# Patient Record
Sex: Male | Born: 1955 | Race: White | Hispanic: No | Smoking: Former smoker
Health system: Southern US, Community
[De-identification: ages and names within clinical notes are randomized; demographics above are authoritative.]

## PROBLEM LIST (undated history)

## (undated) DIAGNOSIS — K7460 Unspecified cirrhosis of liver: Secondary | ICD-10-CM

## (undated) DIAGNOSIS — J90 Pleural effusion, not elsewhere classified: Secondary | ICD-10-CM

## (undated) DIAGNOSIS — B1920 Unspecified viral hepatitis C without hepatic coma: Secondary | ICD-10-CM

## (undated) DIAGNOSIS — S2249XA Multiple fractures of ribs, unspecified side, initial encounter for closed fracture: Secondary | ICD-10-CM

## (undated) DIAGNOSIS — S2239XA Fracture of one rib, unspecified side, initial encounter for closed fracture: Secondary | ICD-10-CM

## (undated) HISTORY — PX: INCISION AND DRAINAGE ABSCESS: SHX5864

## (undated) HISTORY — PX: TONSILLECTOMY: SUR1361

---

## 2010-07-08 ENCOUNTER — Ambulatory Visit (INDEPENDENT_AMBULATORY_CARE_PROVIDER_SITE_OTHER): Payer: BLUE CROSS/BLUE SHIELD | Admitting: Gastroenterology

## 2010-07-08 DIAGNOSIS — B182 Chronic viral hepatitis C: Secondary | ICD-10-CM

## 2010-07-08 DIAGNOSIS — B181 Chronic viral hepatitis B without delta-agent: Secondary | ICD-10-CM

## 2010-08-19 ENCOUNTER — Ambulatory Visit: Payer: BLUE CROSS/BLUE SHIELD | Admitting: Gastroenterology

## 2010-08-24 ENCOUNTER — Other Ambulatory Visit: Payer: Self-pay | Admitting: Gastroenterology

## 2010-08-24 DIAGNOSIS — B1910 Unspecified viral hepatitis B without hepatic coma: Secondary | ICD-10-CM

## 2010-08-25 ENCOUNTER — Ambulatory Visit (HOSPITAL_COMMUNITY)
Admission: RE | Admit: 2010-08-25 | Discharge: 2010-08-25 | Disposition: A | Discharge status: Home / Self Care | Payer: BC Managed Care – PPO | Source: Ambulatory Visit | Attending: Gastroenterology | Admitting: Gastroenterology

## 2010-08-25 ENCOUNTER — Other Ambulatory Visit: Payer: Self-pay | Admitting: Diagnostic Radiology

## 2010-08-25 DIAGNOSIS — B1910 Unspecified viral hepatitis B without hepatic coma: Secondary | ICD-10-CM | POA: Insufficient documentation

## 2010-08-25 DIAGNOSIS — B1920 Unspecified viral hepatitis C without hepatic coma: Secondary | ICD-10-CM | POA: Insufficient documentation

## 2010-08-25 LAB — PROTIME-INR: Prothrombin Time: 14.9 seconds (ref 11.6–15.2)

## 2010-08-25 LAB — CBC
MCH: 32.1 pg (ref 26.0–34.0)
MCHC: 36.7 g/dL — ABNORMAL HIGH (ref 30.0–36.0)
MCV: 87.5 fL (ref 78.0–100.0)
Platelets: 222 10*3/uL (ref 150–400)
RBC: 5.05 MIL/uL (ref 4.22–5.81)

## 2010-08-26 ENCOUNTER — Ambulatory Visit (HOSPITAL_COMMUNITY): Payer: BLUE CROSS/BLUE SHIELD

## 2010-09-02 ENCOUNTER — Encounter: Payer: Self-pay | Admitting: Gastroenterology

## 2010-09-23 ENCOUNTER — Ambulatory Visit (INDEPENDENT_AMBULATORY_CARE_PROVIDER_SITE_OTHER): Payer: BLUE CROSS/BLUE SHIELD | Admitting: Gastroenterology

## 2010-09-23 VITALS — BP 162/98 | HR 87 | Temp 98.0°F | Ht 66.0 in | Wt 246.0 lb

## 2010-09-23 DIAGNOSIS — B181 Chronic viral hepatitis B without delta-agent: Secondary | ICD-10-CM

## 2010-09-23 DIAGNOSIS — B182 Chronic viral hepatitis C: Secondary | ICD-10-CM

## 2010-09-30 NOTE — Progress Notes (Signed)
NAME:  Francisco Floyd, Francisco Floyd    MR#:  [id number]      DATE:  09/23/2010  DOB:  Jul 20, 1955    cc: Consulting Physician:  Marcellus Scott, MD, [facility], [address], [place], [place] [address], [place] [phone]  Referring Physician:  [name], FNP c/o Louanna Raw, [facility], [address], [place], [place] [address], Fax [phone]  [name],, MD, [facility], [address], [place], [place] [address], Fax [phone]    REASON FOR VISIT:  Follow up of E antibody positive, genotype unknown inactive hepatitis B and genotype 1a hepatitis C coinfection.    History:  The patient returns today unaccompanied. Since last being seen, he has no symptoms referable to his liver disease. There are no symptoms to suggest cryoglobulin mediated or decompensated liver disease.   PAST MEDICAL HISTORY:  Significant for history of lung disease. I received notes from Dr. Blenda Nicely, his pulmonologist, but there is no definitive diagnosis given in the notes I received. However, today the patient reports he has no  symptoms on his current medications. He also has a history of psoriatic arthritis, which is somewhat active and limits his mobility, and therefore his ability to weight.  Reviewing the notes from Dr.  Corliss Skains, she acknowledges difficulty in trying to treat his psoriatic disease because of his viral hepatitis.    CURRENT MEDICATIONS:  Dulera aerosol inhalation daily, Bystolic 10 mg p.o. daily, oxycodone 5 mg p.o. q. 6 hours p.r.n. for joint pains, omeprazole 20 mg p.o. daily, for an occasional single outbreak will take Neurontin 300 mg  p.o. p.r.n. and Acyclovir 200 mg p.o. daily. In addition he is on a number of supplements including fish oil 1000 mg b.i.d., Ester C 1000 mg daily, milk thistle 1000 mg b.i.d., aspirin 81 mg p.o. daily,  chromium picolinate, Histidine 500 mg p.o. daily, vitamin D 3000  units b.i.d., vitamin B12 1000 mcg p.o. t.i.d., B complex t.i.d., ginseng 100 mg p.o. b.i.d., Acai 1000 mg b.i.d., ginkgo biloba 120 mg p.o.  b.i.d., acetyl L-carnitine 400 mg p.o. daily, alpha lipoic acid 200 mg p.o. daily.   ALLERGIES:  Flagyl causes erythema.   HABITS:  Quit smoking in 2000. Alcohol still reports drinking up to 12 beers per week.    REVIEW OF SYSTEMS:  All 10 systems reviewed today with the patient and they are negative other than which was mentioned above and is significant only for the arthralgias.  CES-D was 14.    PHYSICAL EXAMINATION:   Constitutional:  Central obesity, but otherwise, appears well. Vital signs: Height 66 inches. Weight 246 pounds, blood pressure 152/98, pulse of 87, temperature 98 Fahrenheit.    laboratories:  Testing from 07/08/2010; his INR was 1.23, albumin was 3.8, total bilirubin 1.1, creatinine was 0.76. Total A antibody was positive, B surface antigen was positive, B surface antibody negative, E antibody  positive, E antigen negative. His HBV DNA was detectable but not quantifiable, i.e. less than 20 international units per mL. His delta antibody was negative.  In terms of his hepatitis C, his genotype was 1a, with a viral load of 2,170,000 international units per mL.  Biopsy on 08/25/2010, showed cirrhosis. The iron stain was negative. The PAS stain was also negative.   assessment:  The patient is a 55 year old gentleman with history of E antibody positive, genotype unknown hepatitis B that is currently inactive and genotype 1a hepatitis C with a biopsy on 08/25/2010, showing  stage IV  disease, i.e., cirrhosis.  In terms of his genotype 1a hepatitis C, I do not see any absolute contraindication to trying to treat him, although it is possible that if he had the psoriatic arthritis, it would flare up on treatment. 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 Fortunately, at that point  we could discontinue treatment if it was that burdensome to him. I would  consider using boceprevir so that he could receive a run of interferon and ribavirin first to see if he the develops a flare of his psoriasis before starting on the protease inhibitor so if we have to stop  treatment, the initial attempt at treatment would not foster resistance to an incompletely used protease inhibitor.  In terms of his hepatitis B, currently he is viral load is less than 20 international units per mL and therefore does not need therapy for this as a compensated cirrhotic.  Interferon can be given to a compensated cirrhotic with Hep B.  In terms of his cirrhosis care, there is no indication of thrombocytopenia, so I do not think he needs to be screened for varices. Of course, he needs to stop drinking. There is no ascites or  encephalopathy to treat. He needs hepatocellular cancer surveillance. He is hepatitis A immune by his lab testing.  In my discussion today with the patient, we discussed his results of lab testing and biopsy.  I have explained to him that the next step would be to attempt treatment of his hepatitis C. I have explained to him though that the interferon could worsen his psoriasis, which he states he was aware of. He has a brother who has been treated for hepatitis C. We discussed our treatment protocol including lab testing. I reviewed the specific system, constitutional, psychiatric side effects of therapy. We also discussed the need for screening ultrasound for hepatocellular cancer.   I told him he must not drink alcohol.  The patient expressed considerable concern about the cost of all medications. He reports he has 30% co-pay on all prescriptions and other medical expenses. He is concerned he may not be of able to afford therapy. I explained to him that the only thing I could do was run this through his insurance, which is [employer] with [employer] as the pharmacy Manufacturing systems engineer to  see what will be approved.   PLAN:  1. I have asked for an ultrasound of the liver to be done at [facility] for his convenience. This will need to be repeated every 6 months. 2. There is no indication for endoscopy to screen for varices. 3. He is hepatitis A immune. 4. I will submit applications for pegylated interferon, ribavirin, and boceprevir to see how much they cost. 5. I will tentatively schedule him for a 4-6 month follow up in case he does not start on therapy. At that point, we can book his next surveillance ultrasound, if he is not on therapy.            [name], MD   ADDENDUM: Prior auth received.  Faxed forms to [facility].  [phone] .[id number]  D:  Thu Jun 28 17:34:56 2012 ; T:  Thu Jun 28 20:36:12 2012  Job #:  [id number]

## 2010-10-21 ENCOUNTER — Other Ambulatory Visit: Payer: Self-pay | Admitting: Gastroenterology

## 2010-10-21 ENCOUNTER — Ambulatory Visit (INDEPENDENT_AMBULATORY_CARE_PROVIDER_SITE_OTHER): Payer: BC Managed Care – PPO | Admitting: Gastroenterology

## 2010-10-21 DIAGNOSIS — B182 Chronic viral hepatitis C: Secondary | ICD-10-CM

## 2010-10-28 NOTE — Progress Notes (Signed)
NAME:  Francisco Floyd, Francisco Floyd    MR#:  [id number]      DATE:  10/21/2010  DOB:  [birth year]    cc: Consulting Physician:  Shirlee Limerick, MD, [facility], [address], [address], [place], [place] [address], [place] [phone] Referring Physician:  [name], FNP, C/O Louanna Raw, MD, [facility], [address], [place], [place] [address], Fax [phone] [name], MD, [facility], [address], [place], [place] [address], Fax [phone]    REASON FOR VISIT:  Follow up of E antibody positive, genotype unknown inactive hepatitis B and genotype 1a hepatitis C co-infection.    History:  The patient returns today unaccompanied. He is here to be taught how to self administer his Pegasys, ribavirin and boceprevir. He has received a shipment of the Pegasys and ribavirin from [place] and has been told he will receive boceprevir at a later date. He currently has no symptoms referable to his history of hepatitis B or C and there are no symptoms to suggest cryoglobulin mediated or decompensated liver disease.   Past medical history:  Significant for a lung disease. As previously mentioned. I reviewed Dr. Minerva Fester these notes and was unable to conclude what the diagnosis was but it but does not appear to be a contraindication to  treating his hepatitis C. He has  psoriatic disease particularly psoriatic arthritis, which is why I have chosen to use interferon and  ribavirin in combination first before adding the boceprevir in case he has a significant flare on interferon.   CURRENT MEDICATION:  Dulera aerosol inhalation daily, Bystolic 10 mg p.o. daily, oxycodone 5 mg p.o. q. 6 hours p.r.n. for arthralgias, omeprazole 20 mg p.o. daily, Neurontin 300 mg p.o. p.r.n., acyclovir 200 mg p.o. daily. He  continues on fish oil 1000 mg p.o. b.i.d., Ester C 1000 mg p.o. daily, milk thistle 1000 mg p.o. b.i.d., aspirin 81 mg  p.o. daily, chromium picolinate histidine 500 mg p.o. daily, vitamin D 3000 units p.o.  daily, vitamin B12 1000 mcg p.o. t.i.d., B complex t.i.d., Acai 1000 mg p.o. b.i.d., Acetyl L-carnitine 400 mg p.o. daily, alpha lipoic acid 200 mg p.o. daily. He has been told to stop the ginseng and the gingko biloba by the pharmacy.   ALLERGIES:  Flagyl causes erythema.    Habits:  Smoking, quit in 2000. Alcohol denies any alcohol consumption since last being seen.   REVIEW OF SYSTEMS:  All 10 systems reviewed today with. [employer] and negative other which is mentioned above. His CES-D was 13.   Physical examination:  Constitutional:  Central obesity. Vital signs: Height 66 inches, weight 248 pounds, blood pressure 165/106, pulse of 89, temperature 97.7 Fahrenheit.   LABORATORY STUDIES:  Labs from 07/08/2010 were reviewed.  Of note, he is hepatitis A immune, hepatitis B surface antigen was positive, but his HBV DNA was detectable but less than 20 international units per mL, and his E  antibody was positive. His HCV RNA was 2,170,000 international units per mL, genotype 1a.   Assessment:  The patient is a 55 year old gentleman with history of genotype 1a hepatitis C co-infection with E antibody positive genotype unknown in active hepatitis B with a biopsy on 08/25/2010 showing macro steatosis  and cirrhosis.  He is well compensated despite his cirrhosis. We plan to start him on hepatitis C therapy consisting of pegylated interferon and ribavirin and boceprevir. His start date will be tomorrow, 10/22/2010.  Today, I demonstrated to the  patient how to dose his ribavirin and the pegylated interferon with the PEG-assist demonstration kit in addition to his own medication, which he brought in today. I gave him the  boceprevir education kit from the manufacturer to use and went through the list of side effects and how to manage these in addition to drug interactions.  In terms of his cirrhosis care, there is  no indication of thrombocytopenia, so I do not think he needs to be screened for varices. There is no ascites or encephalopathy to treat. He is  hepatitis A immune by lab testing. His HCC screening has been accomplished through an ultrasound of the liver on 10/13/2010, which shows a liver that is dense but normal in size. This will need to be repeated in January 2013.   plan:  1. Hepatitis A immune. 2. HCV RNA today as pretreatment viral load. 3. Start on Pegasys and ribavirin tomorrow, 10/22/2010. 4. He is to return on 11/04/2010 for his 2 week visit at which time we will do a CBC. 5. Boceprevir will be added after 11/12/2010. 6. Will need another ultrasound in January 2013.            [name], MD    ADDENDUM  HCV RNA 1 140 000 IU/mL  [phone] .[id number]  D:  Thu Jul 26 18:58:23 2012 ; T:  Thu Jul 26 22:13:06 2012  Job #:  [id number]

## 2010-11-04 ENCOUNTER — Ambulatory Visit (INDEPENDENT_AMBULATORY_CARE_PROVIDER_SITE_OTHER): Payer: BC Managed Care – PPO | Admitting: Gastroenterology

## 2010-11-04 ENCOUNTER — Other Ambulatory Visit: Payer: Self-pay | Admitting: Gastroenterology

## 2010-11-04 VITALS — BP 144/92 | HR 85 | Temp 97.8°F | Ht 66.0 in | Wt 246.0 lb

## 2010-11-04 DIAGNOSIS — B181 Chronic viral hepatitis B without delta-agent: Secondary | ICD-10-CM

## 2010-11-04 DIAGNOSIS — B182 Chronic viral hepatitis C: Secondary | ICD-10-CM

## 2010-11-04 LAB — CBC WITH DIFFERENTIAL/PLATELET
Eosinophils Absolute: 0 10*3/uL (ref 0.0–0.7)
Eosinophils Relative: 0 % (ref 0–5)
HCT: 39 % (ref 39.0–52.0)
Hemoglobin: 13.4 g/dL (ref 13.0–17.0)
Lymphocytes Relative: 36 % (ref 12–46)
Lymphs Abs: 2 10*3/uL (ref 0.7–4.0)
MCH: 30.9 pg (ref 26.0–34.0)
MCV: 90.1 fL (ref 78.0–100.0)
Monocytes Relative: 10 % (ref 3–12)
Platelets: 175 10*3/uL (ref 150–400)
RBC: 4.33 MIL/uL (ref 4.22–5.81)
WBC: 5.6 10*3/uL (ref 4.0–10.5)

## 2010-11-11 NOTE — Progress Notes (Signed)
Francisco Floyd, Francisco Floyd    MR#:  [id number]      DATE:  11/04/2010  DOB:  [birth year]    cc:     Referring physician:  [name], FNP c/o Louanna Raw, MD  [facility] [address]  [place], [place]   [address] Fax:  [phone]   Primary care physician:  [name], FNP C/o Louanna Raw, MD  [facility] [address]  [place], [place]   [address] Fax:  [phone]   CONSULTING PHYSICIANS:  Shirlee Limerick, MD  [facility]  [address], [address]  [place], [place]   [address] Fax:  [phone]    [name], MD  [facility] [address], [address]  [place],  [place]   [address] Fax:  [phone]   REASON FOR VISIT:  Follow up of E antibody positive, genotype unknown, inactive hepatitis B and genotype 1A hepatitis C co-infection, on hepatitis C therapy at week 2.   History:  The patient returns today unaccompanied. His start date for Pegasys and ribavirin was 10/22/2010, putting him at week 2 of therapy, with his third dose of Pegasys to be tomorrow.  He is due to start  boceprevir on 11/19/2010.  He is tolerating therapy with slight fevers right after his PEG-interferon therapy, but otherwise he has no complaints.   PAST MEDICAL HISTORY:  No interval change.   CURRENT MEDICATIONS:  1. Bystolic 10 mg p.o. daily. 2. Dulera aerosol inhalation daily. 3. Oxycodone 5 mg p.o. q. 6 hours p.r.n. for arthralgias. 4. Omeprazole 20 mg p.o. daily. 5. Neurontin 300 mg p.o. p.r.n. 6. Acyclovir 200 mg p.o. daily. 7. Fish oil 1000 mg p.o. b.i.d.  8. Ester-C 1000 mg p.o. daily. 9. Milk thistle 1000 mg p.o. b.i.d. 10. Aspirin 81 mg p.o. daily. 11. Chromium picolinate histidine 500 mg p.o. daily. 12. Vitamin D, 3000 units p.o. daily. 13. Vitamin B12 1000 micrograms p.o. t.i.d.  14. B complex t.i.d.  15. Acetyl L-carnitine 400 mg p.o.  daily. 16. Alpha lipoic acid 200 mg p.o. daily. 17. Pegasys 180 micrograms subcu weekly. 18. Ribavirin 600 mg p.o. b.i.d.  ALLERGIES:  Flagyl causes erythema.    Habits:   Smoking:  Quit in 2000.   Alcohol:  Denies interval consumption since last being seen.   REVIEW OF SYSTEMS:  All 10 systems reviewed today with the patient and are negative other than which was mentioned above. His CES-D was 57.     PHYSICAL EXAMINATION:   Constitutional:  Central obesity.   Vital signs:  Height 66 inches, weight 246 pounds.  Blood pressure 144/92, pulse of 85, temperature 97.8.   Laboratory data:  His pretreatment viral load 10/21/2010 was 1,140,000 international units per milliliter.   ASSESSMENT:  The patient is a 55 year old gentleman with a history genotype hepatitis C co-infected with E antibody positive genotype unknown inactive hepatitis B.  A biopsy on 08/25/2010 showed macrosteatosis  and cirrhosis.  His IL 28B status is unknown, but he is committed to a year of therapy because of cirrhosis. He is currently at week 2 of a planned 48 weeks of pegylated interferon and ribavirin with boceprevir  to be added at week 4.  In terms of his cirrhosis, he has been screened for [facility] by ultrasound 10/13/2010, and he needs another screening test by January 2013. There is no ascites or encephalopathy to treat. He does not have  thrombocytopenia, so he does  not need screening for varices.  He is hepatitis A immune by laboratory testing.  In terms of hepatitis B, it has previously been inactive and does not require therapy at this time but will require monitoring within next HBV DNA to be done around now, but I would like to wait a little  longer while on interferon to see the effect of treating his hepatitis C on his hepatitis B.  In my discussion today with the patient, we reviewed his course of therapy today. I have explained to him that he will have a CBC today, continue with interferon and ribavirin,  and then start boceprevir on  10/19/2010 after he sees me at his next appointment at week 4.  He indicates that he will contact his [facility], his pharmacy, before running out of his medications at the end of the month.   plan:  1. CBC today. 2. To return in 2 weeks' time on 11/18/2010 for his week 4 visit, at which time will do an RNA and CBC and instruct him on starting boceprevir.  He is hepatitis A immune. 3. I will check an HBV DNA at week 8 of HCV therapy. 4. Needs another imaging of the liver by January 2013.            [name], MD   [phone]  D:  Thu Aug 09 19:12:53 2012 ; T:  Sat Aug 11 11:59:10 2012  Job #:  [id number]

## 2010-11-18 ENCOUNTER — Ambulatory Visit (INDEPENDENT_AMBULATORY_CARE_PROVIDER_SITE_OTHER): Payer: BC Managed Care – PPO | Admitting: Gastroenterology

## 2010-11-18 DIAGNOSIS — B182 Chronic viral hepatitis C: Secondary | ICD-10-CM

## 2010-11-18 LAB — CBC WITH DIFFERENTIAL/PLATELET
Eosinophils Relative: 1 % (ref 0–5)
HCT: 38.2 % — ABNORMAL LOW (ref 39.0–52.0)
Hemoglobin: 12.7 g/dL — ABNORMAL LOW (ref 13.0–17.0)
Lymphocytes Relative: 33 % (ref 12–46)
MCHC: 33.2 g/dL (ref 30.0–36.0)
MCV: 93.2 fL (ref 78.0–100.0)
Monocytes Absolute: 0.5 10*3/uL (ref 0.1–1.0)
Monocytes Relative: 11 % (ref 3–12)
Neutro Abs: 2.6 10*3/uL (ref 1.7–7.7)
RDW: 16.2 % — ABNORMAL HIGH (ref 11.5–15.5)

## 2010-12-02 ENCOUNTER — Ambulatory Visit (INDEPENDENT_AMBULATORY_CARE_PROVIDER_SITE_OTHER): Payer: BC Managed Care – PPO | Admitting: Gastroenterology

## 2010-12-02 DIAGNOSIS — B182 Chronic viral hepatitis C: Secondary | ICD-10-CM

## 2010-12-02 NOTE — Progress Notes (Signed)
Referring physician:  Rosezella Rumpf, FNP c/o Louanna Raw, MD  [facility] [address] [place], [place]   [address] Fax:  [phone]    Primary care physician:  Rosezella Rumpf, FNP c/o Louanna Raw, MD  [facility] [address] [place], [place]   [address] Fax:  [phone]    Consulting physician:  Shirlee Limerick, MD  [facility]  [address], [address]  [place], [place]   [address] Fax: [phone]   [name], MD [facility]  [address] [place],  [place]   [address] Fax:  [phone]   REASON FOR VISIT:  Follow up of E antibody positive, genotype unknown, inactive hepatitis B, and genotype 1A hepatitis C co-infection, on hepatitis C therapy at week 4.   history:  The patient returns today unaccompanied. His start date for Pegasys and ribavirin was 10/22/2010, putting him at approximately week 4 of treatment, or 27 days or partial week 4 of therapy.  He is due to  boceprevir tomorrow to add to his PEG interferon and ribavirin. He complains of poor sleep and some irritability at times. He brings a  symptom log in today which reflects this. Otherwise, are no significant side effects of treatment at this time.   PAST MEDICAL HISTORY:  No interval change.   CURRENT MEDICATIONS:  1. Bystolic 10 mg p.o. daily.  2. Dulera aerosol inhalation daily. 3. Oxycodone 5 mg p.o. q. 6 hours p.r.n. for arthralgias. 4. Omeprazole 20 mg p.o. daily.  5. Neurontin 300 mg p.o. p.r.n.  6. Acyclovir 200 mg p.o. daily. 7. Fish oil 1000 mg p.o. b.i.d.  8. Ester-C 1000 mg p.o. daily. 9. Milk thistle 1000 mg p.o. b.i.d. 10. Aspirin 81 mg p.o. daily. 11. Chromium picolinate histidine 500 mg p.o. daily. 12. Vitamin D 3000 units daily. 13. Vitamin B12 1000 micrograms p.o. t.i.d.  14. Vitamin B complex t.i.d.  15. Acetyl L-carnitine 400 mg p.o. daily. 16. Alpha lipoic acid 200 mg p.o.  daily. 17. Pegasys 180 micrograms subcu weekly. 18. Ribavirin 600 mg p.o. b.i.d.  ALLERGIES:  Flagyl causes erythema.    Smoking:  Quit in 2000.     Alcohol:  Denies interval consumption.   REVIEW OF SYSTEMS:  All 10 systems reviewed today with the patient and are negative other than that mentioned above. His CES-D was 13.   PHYSICAL EXAMINATION:   Constitutional:  Well appearing.   Vital signs:  Height 66 inches, weight 246 pounds.  Blood pressure 147/84, pulse of 80, temperature 98 degrees Fahrenheit.    IMPRESSION:  The patient is a 55 year old gentleman with a history of genotype unknown, inactive hepatitis B, and genotype 1A hepatitis co-infection. IL28-B status is unknown. A biopsy on 08/25/2010 showed macrosteatosis  with cirrhosis. Because of the cirrhosis, he is planned to be treated for 48 weeks. He is currently in week 4, or day 27. His pretreatment viral load was 1,140,000 international units per milliliter.   Telaprevir is to be added tomorrow.   In terms of cirrhosis care, he has been screened for hepatocellular carcinoma by ultrasound of 10/13/2010 and needs another one by January 2013. There is no ascites or encephalopathy to treat, and he does not  need endoscopy because he does not have thrombocytopenia. He is hepatitis A immune by laboratory testing.  In terms of hepatitis B, it does not require therapy at this time provided the HBV DNA is suppressed. This will need to  be tracked on therapy for his hepatitis C.  In my discussion today with the patient, we reviewed his course of treatments to date.  He is aware of how to start the boceprevir and has this on hand.   plan:  1. CBC today. 2. HCV RNA today. 3. He is hepatitis A immune. 4. I will check an HBV DNA in 4 weeks' time at week 8 of HCV therapy. 5. Need another imaging study of the liver by January 2013. 6. Follow up in 2 weeks' time, at which time will discuss putting him on weekly CBCs.             [name], MD   ADDENDUM:  HCV RNA not done as ordered.  [phone] .[id number]  D:  Thu Aug 23 20:46:00 2012 ; T:  Sat Aug 25 12:46:22 2012  Job #:  [id number]

## 2010-12-03 LAB — CBC WITH DIFFERENTIAL/PLATELET
Eosinophils Relative: 0 % (ref 0–5)
HCT: 36.5 % — ABNORMAL LOW (ref 39.0–52.0)
Hemoglobin: 11.9 g/dL — ABNORMAL LOW (ref 13.0–17.0)
Lymphocytes Relative: 36 % (ref 12–46)
Lymphs Abs: 1.6 10*3/uL (ref 0.7–4.0)
MCV: 95.5 fL (ref 78.0–100.0)
Monocytes Absolute: 0.6 10*3/uL (ref 0.1–1.0)
Monocytes Relative: 12 % (ref 3–12)
Neutro Abs: 2.3 10*3/uL (ref 1.7–7.7)
WBC: 4.5 10*3/uL (ref 4.0–10.5)

## 2010-12-04 LAB — HEPATITIS C RNA QUANTITATIVE: HCV Quantitative Log: <1.63 {Log} (ref <1.63)

## 2010-12-16 ENCOUNTER — Ambulatory Visit (INDEPENDENT_AMBULATORY_CARE_PROVIDER_SITE_OTHER): Payer: BC Managed Care – PPO | Admitting: Gastroenterology

## 2010-12-16 ENCOUNTER — Other Ambulatory Visit: Payer: Self-pay | Admitting: Gastroenterology

## 2010-12-16 DIAGNOSIS — B182 Chronic viral hepatitis C: Secondary | ICD-10-CM

## 2010-12-16 DIAGNOSIS — B181 Chronic viral hepatitis B without delta-agent: Secondary | ICD-10-CM

## 2010-12-16 NOTE — Progress Notes (Signed)
Francisco Floyd, Francisco Floyd    MR#:  [id number]      DATE:  12/02/2010  DOB:  [birth year]    cc:     Referring physician:  Rosezella Rumpf, FNP c/o Louanna Raw, MD  [facility]  [address]  [place], [place]   [address] Fax:  [phone]    Primary care physician:  Rosezella Rumpf, FNP c/o Louanna Raw, MD  [facility]  [address]  [place], [place]   [address] Fax:  [phone]   CONSULTING PHYSICIANS:   Shirlee Limerick, MD [facility]  [address] [place], [place]   [address] Fax:  [phone]  Mercie Eon, MD  [facility]  [address] [place], [place]   [address] Fax:  [phone]   REASON FOR VISIT:  Follow up of E antibody positive, genotype unknown, inactive hepatitis B, and genotype 1A hepatitis C co-infection, on hepatitis C therapy at week 6.   History:  The patient returns unaccompanied. His start date for Pegasys and ribavirin was 10/22/2010, putting him at week 6 of therapy.  Boceprevir was started at his last clinic appointment on 11/19/2010.   He continues to complain of poor sleep and some irritability but no other significant symptoms.   PAST MEDICAL HISTORY:  No interval change.   CURRENT MEDICATIONS:  1. Bystolic 10 mg p.o. daily.  2. Dulera aerosol inhalation daily. 3. Oxycodone 5 mg p.o. q. 6 hours p.r.n. for arthralgias. 4. Omeprazole 20 mg p.o. daily.  5. Neurontin 300 mg p.o. p.r.n.  6. Acyclovir 200 mg p.o. daily. 7. Fish oil 1000 mg p.o. b.i.d.  8. Ester-C 1000 mg p.o. daily. 9. Milk thistle 1000 mg p.o. b.i.d. 10. Aspirin 81 mg p.o. daily. 11. Chromium picolinate histidine 500 mg p.o. daily. 12. Vitamin D 3000 units daily. 13. Vitamin B12 1000 micrograms p.o. t.i.d.  14. Vitamin B complex t.i.d.  15. Acetyl L-carnitine 400 mg p.o. daily. 16. Alpha lipoic acid 200 mg p.o. daily. 17. Pegasys 180 micrograms subcu  weekly. 18. Ribavirin 600 mg p.o. b.i.d. 19. Boceprevir 800 mg p.o. q.8 h.  ALLERGIES:  Flagyl causes erythema.     habits:  Smoking:  Quit in 2000.  Alcohol: Denies interval consumption.   REVIEW OF SYSTEMS:  All 10 systems reviewed today with the patient, and they are negative other than that which is mentioned above. CES-D was 18.   PHYSICAL EXAMINATION:   General appearance:  Well appearing with some element of central obesity.   Vital signs:  Height 66 inches.  Weight 244 pounds down 2 pounds from previously.  Blood pressure 143/80, pulse of 83, temperature 98.3 degrees Fahrenheit.    History of presenting illness:  The patient is a 55 year old gentleman with a history of genotype unknown inactive hepatitis B and genotype hepatitis C co-infection.  His IL28B status is unknown. Biopsy on 08/25/2010 showed  macrosteatosis with cirrhosis.  Because of the cirrhosis I plan to treat him for 48 weeks with Pegasys, ribavirin, and boceprevir. He is currently at week 6. His pretreatment viral load was 1,140,000  international units per mL. Unfortunately, due to a laboratory error, despite the fact that I ordered his HCV RNA at week 4 it was not drawn. He started boceprevir on 11/19/2010.  In terms of his cirrhosis care, he has been screened for hepatocellular carcinoma by ultrasound on 11/12/2010, and he needs another one by 2013. There is no ascites  or encephalopathy to treat.  He does not have thrombocytopenia, so he does not endoscopy. He has not bled, so he does not need endoscopy. He is hepatitis A immune by laboratory testing.   In terms of his hepatitis B, he does not require therapy at this time provided his HBV DNA is suppressed. Will need to repeat this while he is undergoing therapy for his hepatitis C.  In my discussion today with the patient we reviewed the course of his treatments to date.   plan:  1. Will draw an HCV RNA today in lieu of the missing one from  11/18/2010. 2. CBC with differential today. 3. Hepatitis A immune. 4. At week 8 of his HCV therapy in 2 weeks' time will check his HBV DNA and his hepatitis B serology. 5. Imaging study of the liver by January 2013. 6. No need for endoscopy. 7. Follow up in 2 weeks' time at week 8, which will also be time to draw his next HCV RNA.            [name], MD    ADDENDUM:  HCV RNA is detected but not quantifiable at week 6 (in lieu of week 4).   [phone]  D:  Thu Sep 06 20:35:46 2012 ; T:  Sat Sep 08 17:03:40 2012  Job #:  [id number]

## 2010-12-17 LAB — HEPATITIS B SURFACE ANTIGEN: Hepatitis B Surface Ag: POSITIVE — AB

## 2010-12-17 LAB — HEPATITIS B SURFACE ANTIBODY,QUALITATIVE: Hep B S Ab: NEGATIVE

## 2010-12-20 LAB — HEPATITIS B E ANTIGEN: Hepatitis Be Antigen: NEGATIVE

## 2010-12-20 LAB — HEPATITIS B E ANTIBODY: Hepatitis Be Antibody: POSITIVE — AB

## 2010-12-21 LAB — HEPATITIS B DNA, ULTRAQUANTITATIVE, PCR

## 2010-12-23 NOTE — Progress Notes (Addendum)
Francisco Floyd, Francisco Floyd    Francisco#:  [id number]      DATE:  12/19/2010  DOB:  [birth year]    cc:  REFERRING PHYSICIAN:  Rosezella Rumpf, FNP c/o Louanna Raw, MD  [facility]  [address]  [place], [place] [address] Fax: [phone]   PRIMARY CARE PHYSICIAN:  Rosezella Rumpf, [place] c/o Louanna Raw, MD  [facility]  [address]  [place], [place] [address] Fax: [phone]   CONSULTING PHYSICIANS:  Marcellus Scott, MD [facility]  [address] [place] [place], [place] [address] Fax: [phone]   [name], MD  [facility]  [address] [place], [place] [address] Fax: [phone]    REASON FOR VISIT:  Follow up of E antibody positive, genotype unknown, inactive hepatitis B and genotype 1a hepatitis C, following commencement of hepatitis C therapy at week 8.    History:  The patient returns today unaccompanied. His start date for Pegasys and ribavirin was 10/21/2000. He has completed 8 weeks of therapy and takes his week 9 injection of PEG assist tomorrow. He reports he is  now going to switch his Pegasys injections to Thursday nights, however. He complains of poor sleep and some irritability but no other significant symptoms.   PAST MEDICAL HISTORY:  No interval change.   CURRENT MEDICATIONS:  1. Bystolic 10 mg p.o. daily.  2. Dulera aerosol inhalation daily. 3. Oxycodone 5 mg p.o. q. 6 hours p.r.n. for arthralgias. 4. Omeprazole 20 mg p.o. daily.  5. Neurontin 300 mg p.o. p.r.n.  6. Acyclovir 200 mg p.o. daily. 7. Fish oil 1000 mg p.o. b.i.d.  8. Ester-C 1000 mg p.o. daily. 9. Milk thistle 1000 mg p.o. b.i.d. 10. Aspirin 81 mg p.o. daily. 11. Chromium picolinate histidine 500 mg p.o. daily. 12. Vitamin D 3000 units daily. 13. Vitamin B12 1000 micrograms p.o. t.i.d.  14. Vitamin B complex t.i.d.  15. Acetyl L-carnitine 400 mg p.o. daily. 16. Alpha lipoic acid 200 mg p.o.  daily. 17. Pegasys 180 micrograms subcu weekly. 18. Ribavirin 600 mg p.o. b.i.d.       19. Boceprevir 800 mg p.o. q.8 h.   ALLERGIES:  Flagyl causes erythema.    Habits:  Smoking quit in 2000. Alcohol, denies interval consumption.   REVIEW OF SYSTEMS:  All 10 systems reviewed today with the patient and they are negative other than which was mentioned above. CES-D was not completed.    PHYSICAL EXAMINATION:   Constitutional:  Well appearing with some central obesity. Vital signs: Height 66 inches, weight 247 pounds, blood pressure 147/82, pulse 82, temperature 97.4 Fahrenheit.    LABORATORY STUDIES:  From 12/02/2010; hemoglobin 11.9, white count 4.5, ANC 2.3, and platelet count of 140. His HCV RNA 12/02/2010, at week 6 was  detectable but not quantifiable. It will be recalled a week 4 HCV RNA was not drawn though ordered.   ASSESSMENT:  The patient is a 55 year old gentleman with history of genotype unknown, inactive hepatitis B, and genotype 1a hepatitis C co-infection. His IL 28 B status is unknown. Biopsy on 08/25/2010, showed  macrosteatosis with cirrhosis. He is on a planned 48 weeks of Pegasys, ribavirin, and boceprevir because of his cirrhosis on biopsy. He has currently completed 8 weeks of treatment. His pretreatment viral load  was 1,140,000 international units/mL and by week 6 it was detectable but not quantifiable. His boceprevir started on 11/19/2010.  In terms of cirrhosis care, he has  been screened for hepatocellular carcinoma  on 11/12/2010. He needs another one by late February or early March 2013. There is no ascites or encephalopathy to treat. He  does not have thrombocytopenia so he does not need an endoscopy. He did not have thrombocytopenia nor has he bled, so he does not need an endoscopy. He is hepatitis A immune by laboratory testing.  In terms of hepatitis B, he does not require therapy as long as his viral load is suppressed. It will need to be repeated while  he is on therapy for hepatitis C in case this emerges.  In my discussion today with the patient, we discussed his course to date, we discussed side effect management. Because of poor sleep, he has been asked by his primary physician as to whether he can get  Lexapro. I have explained this interacts with his boceprevir and citalopram would be a better choice. He also asked if his labs could be drawn through his primary physician office. I agreed as long as  this would be sent under my name to [facility], which is there lab provider.   plan:  1. Week 8 HCV RNA today, as well as CBC. 2. For complaints of poor sleep, I have given him citalopram 20 mg p.o. daily 30 days supply, 4 refills. 3. I have given him a standing order for his CBCs to be done at our account with [facility]. 4. Hepatitis A immune. 5. No need for endoscopy. 6. Repeat ultrasound by February or March 2013. 7. Return in 4 weeks' time at week 12.            [name], MD   ADDENDUM:  HBV  e Ab positive.  No detectable level of HBV DNA.  CBC, ALT, and HCV RNA cannot be found by [employer].  Will have the ALT added but have to call Francisco Floyd for a CBC and HCV RNA.   ADDENDUM 12/24/10  HCV RNA undetectable.  ADDENDUM Labs 12/29/10  HgB 10.8   [phone]  D:  Thu Sep 20 19:29:37 2012 ; T:  Sun Sep 23 08:48:56 2012  Job #:  [id number]

## 2010-12-24 ENCOUNTER — Telehealth: Payer: Self-pay | Admitting: Gastroenterology

## 2010-12-24 ENCOUNTER — Other Ambulatory Visit: Payer: Self-pay | Admitting: Gastroenterology

## 2010-12-24 NOTE — Telephone Encounter (Signed)
Francisco Floyd was seen by his primary and given azithromycin for "bronchitis" and wanted to check if there was a drug interaction.  I confirmed that there is no interaction.  He is on the way to get his labs redone and I told him that his HBV DNA was undetectable.

## 2010-12-25 LAB — CBC WITH DIFFERENTIAL/PLATELET
Basophils Relative: 0 % (ref 0–1)
Eosinophils Absolute: 0 10*3/uL (ref 0.0–0.7)
Eosinophils Relative: 0 % (ref 0–5)
Lymphs Abs: 1.5 10*3/uL (ref 0.7–4.0)
MCH: 31.7 pg (ref 26.0–34.0)
MCHC: 32.3 g/dL (ref 30.0–36.0)
MCV: 98.1 fL (ref 78.0–100.0)
Platelets: 136 10*3/uL — ABNORMAL LOW (ref 150–400)
RBC: 3.6 MIL/uL — ABNORMAL LOW (ref 4.22–5.81)
RDW: 16.2 % — ABNORMAL HIGH (ref 11.5–15.5)

## 2011-01-14 ENCOUNTER — Ambulatory Visit (INDEPENDENT_AMBULATORY_CARE_PROVIDER_SITE_OTHER): Payer: BC Managed Care – PPO | Admitting: Gastroenterology

## 2011-01-14 DIAGNOSIS — K7460 Unspecified cirrhosis of liver: Secondary | ICD-10-CM

## 2011-01-14 DIAGNOSIS — B182 Chronic viral hepatitis C: Secondary | ICD-10-CM

## 2011-01-14 DIAGNOSIS — B181 Chronic viral hepatitis B without delta-agent: Secondary | ICD-10-CM

## 2011-01-20 NOTE — Progress Notes (Signed)
NAME:  Francisco Floyd, Francisco Floyd    MR#:  [id number]      DATE:  01/14/2011  DOB:  10/07/1955    cc: Consulting Physician:  Leida Lauth, MD, [facility], [address], [place], [place] [address], [place] [phone]  Primary Care Physician:  Same.  Referring Physician:  [name], FNP, c/o Louanna Raw, MD, [facility], [address] [place], [place] [address], Fax [phone]  [name], MD, [facility], [address], [place], [place] [address], Fax [phone]    REASON FOR VISIT:  Follow up of E antibody positive, genotype unknown, inactive hepatitis B and genotype 1a hepatitis C, treatment week 12 for hepatitis C.   History:  The patient returns today unaccompanied. His start date for Pegasys and ribavirin was 10/22/2010, so that he is approximately week 12 of therapy. His week 8 HCV on boceprevir was undetectable.  Today, the only complaint that he has is that he mentioned about 3 weeks ago he developed some peripheral edema, which he was placed on furosemide. He did not bring his medications today so he does not know  the dose. In addition, he reports being started on an antihypertensive but again he does not know the name of the medication or the dose. He denies any other significant symptoms such as irritability. There are  no symptoms of encephalopathy or development of ascites or interval episodes of GI bleeding.   PAST MEDICAL HISTORY:  No interval change.   CURRENT MEDICATIONS:  Bystolic 10 mg p.o. daily, Dulera aerosol inhalation daily, oxycodone 5 mg p.o. q. 6 hours p.r.n. for arthralgias, omeprazole 20 mg p.o. daily, Neurontin 300 mg p.o. p.r.n., acyclovir 200 mg p.o. daily,   vitamin B12 1000 micrograms p.o. t.i.d., vitamin B complex t.i.d., Pegasys 180 mcg subcu weekly, ribavirin 600 mg p.o. b.i.d., boceprevir 800 mg p.o., furosemide dose unknown, beta-blocker dose unknown.    ALLERGIES:  Flagyl causes erythema.    habits:  Smoking, quit in 2000. Alcohol, denies interval consumption.   REVIEW OF SYSTEMS:  All 10 systems reviewed today with the patient and they are negative other than which was mentioned above. His CES-D was 18.   PHYSICAL EXAMINATION:   Constitutional:  Appeared stated age without significant peripheral wasting. Vital signs: Height 66 inches, weight 242 pounds down from  247 previously, blood pressure 133/78, pulse 71 temperature 96.9 Fahrenheit.   Laboratories:  He is having these done through the [facility] and faxed to me. His CBC on 01/12/2011, showed a white count 2.8, hemoglobin 9.9, ANC 1.8, platelet count of 84,000. His ALT was 74,000.   Previously on 12/29/2010, his hemoglobin was 10.8, white count 4.9, of which the ANC was 3.3 and a platelet count was 112.   ASSESSMENT:  The patient is a 55 year old man with history of genotype 1a hepatitis C, inactive genotype unknown E antibody positive hepatitis B. He is currently on week 12 of HCV therapy with pegylated interferon and  ribavirin and boceprevir. IL 28 status is unknown. Biopsy of 08/25/2010 showed macrosteatosis with cirrhosis.   With an undetectable HCV RNA at week 8, he could be considered for 28 weeks of  therapy, but because of his cirrhosis, I think we should continue treatment for full 48 weeks with the boceprevir  ending at week 36. Because he reports he feels completely fine, I do  not want to reduce his ribavirin because of anemia. However if the anemia persists, we  will need to reduce the ribavirin to 600 mg daily.  His HBV is completely suppressed on therapy, therefore there is no need to treat this. This will need to be followed over time with hepatitis B serologies.  In terms of his cirrhosis care, there is no need for an endoscopy because he has never bled and his pretreatment platelet count is well over 100,000. There is no ascites to treat. I am not  sure about the peripheral edema that he was put on the furosemide for, however. There is no encephalopathy to treat. In terms of his hepatocellular carcinoma screening, he was screened on 11/12/2010, and needs another scan by ultrasound by January 2013.  He is hepatitis A immune by laboratory testing.  In my discussion today with the patient, we discussed his course of therapy to date. His brother was recently hospitalized, and it sounds like he decompensated towards the end of therapy for his hepatitis C.  We discussed the significance of developing some peripheral edema adding the furosemide for this. I have told the patient that if this is to worsen he needs to contact us. He also needs to contact us with  the and the exact dosing of all his medications. We discussed his previous lab work.   PLAN:  1. The patient will continue to have CBCs locally through [facility] for his convenience. 2. No change in medications at this time. 3. He is to call in the names and doses of his new medications. 4. There is no indication for endoscopy. 5. He is hepatitis A immune. 6. Needs another ultrasound by January or February 2013. 7. To return in 4 weeks' time for week 16 visit.            [name], MD   [phone]  D:  Fri Oct 19 19:02:53 2012 ; TWynelle Link Oct 21 09:16:17 2012  Job #:  [id number]

## 2011-02-10 ENCOUNTER — Ambulatory Visit (INDEPENDENT_AMBULATORY_CARE_PROVIDER_SITE_OTHER): Payer: BC Managed Care – PPO | Admitting: Gastroenterology

## 2011-02-10 DIAGNOSIS — B182 Chronic viral hepatitis C: Secondary | ICD-10-CM

## 2011-02-10 DIAGNOSIS — K7460 Unspecified cirrhosis of liver: Secondary | ICD-10-CM

## 2011-02-10 DIAGNOSIS — B181 Chronic viral hepatitis B without delta-agent: Secondary | ICD-10-CM

## 2011-02-16 NOTE — Progress Notes (Signed)
NAMEAPRIL, CARLYON    MR#:  [id number]      DATE:  02/10/2011  DOB:  1956-03-11    cc: Consulting Physician:  Marcellus Scott, MD, [facility], [address], [address], [place], [place] [address], [place] [phone]  Primary Care Physician:  Same.  Referring Physician:  [name], FNP, c/o Louanna Raw, MD, [facility], [address] [place], [place] [address], Fax [phone]  [name], MD, [facility], [address], [address], [place], [place] [address], Fax [phone]    [facility] [id number].   REASON FOR VISIT:  Follow up of E antibody positive, genotype unknown inactive hepatitis B and genotype 1a hepatitis C treatment week 17 for hepatitis C.    History:  The patient returns today unaccompanied. I last saw him at [facility] on 01/26/2011, because of complaints of increasing peripheral edema. I increased his diuretics. Since that time. He has diuresed 13.8 kg. He  already has peripheral edema, but he also wears compression stockings. No shortness of breath or abdominal distention. There is no symptoms of encephalopathy. There is any history of variceal bleeding.  In terms of hepatitis C therapy, he continues to do well on treatment. There are no psychiatric symptoms. He is not troubled by any significant skin rash.   PAST MEDICAL HISTORY:  No interval change.   CURRENT MEDICATIONS:  Ribavirin 400 mg a.m., 200 mg p.m. dose reduced for anemia, Pegasys 180 mcg weekly, boceprevir 800 mg q. 8 hours, omeprazole 20 mg p.o. daily, Oxycodone 5 mg p.o. q. 4 hours for arthralgias, spironolactone  100 mg daily, furosemide 40 mg daily, aspirin 81 mg daily, zinc 50 mg daily, Nasonex p.r.n.   ALLERGIES:  Flagyl causes erythema.   HABITS:  Smoking quit in 2000. Alcohol denies interval consumption.   REVIEW OF SYSTEMS:  All 10 systems reviewed today with the patient and they are negative other than which is  mentioned above. His CES-D was not completed.   PHYSICAL EXAMINATION:   Constitutional:  Appeared stated age without significant peripheral wasting. Vital signs: Height 66 inches, weight 225 pounds, blood pressure 97/69, pulse of 86, temperature 97.9 Fahrenheit. Ears, nose,  mouth, and throat:  Unremarkable oropharynx. No thyromegaly or neck masses. Chest: Resonant to percussion. Clear to auscultation.  Cardiovascular: Heart sounds normal S1, S2. No murmurs or rubs. There is no peripheral edema. He had compression stockings on however.   Abdominal examination:  Normal bowel sounds. No masses or tenderness. I could not appreciate liver edge or spleen tip nor was there ascites.  Lymphatics: No cervical or inguinal lymphadenopathy. Central nervous system: No asterixis or focal neurologic findings.  Dermatologic:  Icteric. No palmar erythema. Eyes: Icteric sclera. Pupils equal and reactive to light.   laboratories:  Most recent labs were done at [facility] on 02/07/2011. His hemoglobin was 12.7, white count 4.8, ANC 3.3, and platelets 133. Electrolytes were acceptable with a creatinine of 1.05, ALT was 75,  total bilirubin 2.6.  Bilirubin was 2.6, albumin 3.5.  Ultrasound on 02/07/2011, showed changes of cirrhosis but no focal liver lesions. There is no mention of ascites.   ASSESSMENT:  The patient is a 56 year old gentleman with history of genotype 1a hepatitis C, inactive genotype unknown E antibody positive hepatitis B. IL 28 B status unknown. His biopsy on 08/25/2010, showed macrosteatosis with cirrhosis. Currently week 17 of therapy of pegylated interferon, ribavirin and boceprevir. He had undetectable HCV RNA at week 8.  He needs to continue on a full 48 weeks of therapy however, because of his cirrhosis. He has required dose reductions in ribavirin because of anemia.  In terms of his cirrhosis care, he had significant peripheral edema when last seen on 01/26/2011. This is improved  with diuresis. This may represent progression of his liver disease on therapy. The ultrasound does not suggest any etiology such as portal vein thrombosis or hepatocellular carcinoma. He has responded nicely to the diuretics. There is no encephalopathy to treat. He does not need a screening endoscopy. He has never bled and his platelet count is over 100,000. His screening of his liver for [facility] was recently done and would not need to be repeated until May 2013. He is hepatitis A immune by testing. In terms of hepatitis B, he is suppressed and, therefore, there is no need to treat this. It will have to be followed over time.  In my discussion today with the patient, we discussed his progress. We discussed the fact that this may represent some days duration liver  function, but at this point, he does not need consideration for transplant.   Plan:  1. Continue with CBC through [facility] for his convenience. 2. Of note, no change in his medication at this time, including at the same dose of ribavirin. 3. No indication for endoscopy. 4. Hepatitis A immune. 5. Need another ultrasound by June 2013. 6. He is to return in 4 weeks' time for his approximately his week 21 visit. 7. I have filled out his FMLA form for his employer.            [name], MD   [phone]  D:  Thu Nov 15 17:47:37 2012 ; T:  Thu Nov 15 19:11:40 2012  Job #:  [id number]

## 2011-03-10 ENCOUNTER — Ambulatory Visit (INDEPENDENT_AMBULATORY_CARE_PROVIDER_SITE_OTHER): Payer: BC Managed Care – PPO | Admitting: Gastroenterology

## 2011-03-10 DIAGNOSIS — B181 Chronic viral hepatitis B without delta-agent: Secondary | ICD-10-CM

## 2011-03-10 DIAGNOSIS — B182 Chronic viral hepatitis C: Secondary | ICD-10-CM

## 2011-03-10 DIAGNOSIS — K7460 Unspecified cirrhosis of liver: Secondary | ICD-10-CM

## 2011-03-17 NOTE — Progress Notes (Signed)
Francisco Floyd, ADKISON    MR#:  [id number]      DATE:  03/10/2011  DOB:  1956/02/14    cc: Consulting Physician: Marcellus Scott, MD, [facility], [address], [address], [place], [place] [address], [place] [phone]   Primary Care Physician: Same.   Referring Physician: [name], FNP, c/o Louanna Raw, MD, [facility], [address], [place], [place] [address], Fax [phone]   [name], MD, [facility], [address], [address], [place], [place] [address], Valinda Hoar [phone]     [facility] Medical Record Number:  [id number]   REASON FOR VISIT:  Follow up of E antibody positive, genotype unknown inactive hepatitis B, and genotype 1A hepatitis C, treatment week 20 for hepatitis C.    HISTORY:  The patient returns today unaccompanied. Since last being seen on 02/10/2011 he has not developed any further peripheral edema or abdominal distention. He remains on his diuretics. There are no  symptoms of encephalopathy. There is no history of any variceal bleeding.  In terms of his hepatitis C therapy, he denies any psychiatric symptoms or rash or any other complaints referable to his treatment. He continues to have his lab work on a weekly basis at [facility] in [place], [place]. He was then he would get his labs done last week because of a death in the family of one of the  providers. He presented earlier this week for labs, the results of which I do not have yet.   PAST MEDICAL HISTORY:  No interval change.   CURRENT MEDICATIONS:  1. Ribavirin 400 mg a.m., 200 mg p.m., dose reduced for anemia. 2. Pegasys 180 micrograms weekly. 3. Boceprevir 800 mg q.8 h.  4. Omeprazole 20 mg daily.  5. Oxycodone 5 mg q.4 h as needed for arthralgias. 6. Spironolactone 100 mg daily. 7. Furosemide 40 mg daily.  8. Aspirin 81 mg daily  9. Nasonex p.r.n.  ALLERGIES:  Flagyl causes erythema.     Habits:   Smoking:  Quit in 2000.   Alcohol:  Denies interval consumption.   REVIEW OF SYSTEMS:  All 10 systems were reviewed with the patient.  He complains of an epigastric discomfort which is particularly pronounced when twisting  at work. Otherwise all 10 systems were reviewed and negative other than that which was mentioned above. His CES-D was 24.   PHYSICAL EXAMINATION:  Constitutional:  Appeared stated age without significant peripheral wasting. Vital signs: Height 66 inches.  Weight 215 pounds, down another 10 pounds from previously. Blood pressure 110/78, pulse of 82,  temperature 98.5 degrees Fahrenheit. Ears, nose, mouth and throat:  Unremarkable oropharynx.  No thyromegaly or neck masses.  Chest:  Resonant to percussion.  Clear to auscultation.  Cardiovascular:   Heart sounds normal S1, S2 without murmurs or rubs.  There is no peripheral edema.  Abdominal:  Normal bowel sounds.  No masses or tenderness.  I could not appreciate a liver edge or spleen tip.  I  could not appreciate any hernias.  Lymphatics:  No cervical or inguinal lymphadenopathy.  Central Nervous System:  No asterixis or focal neurologic findings.  Dermatologic:  Anicteric without palmar  erythema or spider angiomata.  Eyes:  Anicteric sclerae.  Pupils are equal and reactive to light.   Laboratory data:  The laboratories from this week are pending.   Assessment:  The patient is a 55 year old gentleman with a history of genotype hepatitis C, inactive,  genotype unknown, E antibody positive hepatitis B.  IL28B status unknown.  Biopsy of 08/25/2010 showing macrosteatosis  with cirrhosis. He is currently on week 20 of pegylated interferon, ribavirin and boceprevir. His week 8 hepatitis C virus RNA was undetectable. He will need a full 48 weeks of therapy because of  cirrhosis. He has required dose reductions in his ribavirin because of anemia.  In terms of his cirrhosis care, his edema is significantly improved with  diuresis. He underwent ultrasound on 02/07/2011, which did not show any evidence of portal vein thrombosis or hepatocellular  carcinoma. This would need to be repeated by May 2013. There is no encephalopathy, and he does not need a screening endoscopy as he has never bled and his platelet count is well over 100,000.  He is hepatitis A immune by testing. In terms of hepatitis B, he is suppressed and does not need vaccination or treatment at this time.    In my discussion today with the patient we discussed his progress to date.   PLAN:  1. He will continue with CBCs at [facility] for his convenience. 2. He will need another ultrasound in May 2013. 3. No indication for endoscopy. 4. Hepatitis A immune. 5. He will return in approximately 4 weeks' time in follow up.  At that time he will be at approximately 24 weeks, at which time will draw his week 24 hepatitis C virus RNA.            [name], MD   ADDENDUM:  CBC 03/14/11  WBC 6  HgB 12.0  Plts 143  ANC 2.7  403 .[id number]  D:  Thu Dec 13 20:22:39 2012 ; T:  Sat Dec 15 13:09:49 2012  Job #:  [id number]

## 2011-04-07 ENCOUNTER — Ambulatory Visit (INDEPENDENT_AMBULATORY_CARE_PROVIDER_SITE_OTHER): Payer: BC Managed Care – PPO | Admitting: Gastroenterology

## 2011-04-07 DIAGNOSIS — B182 Chronic viral hepatitis C: Secondary | ICD-10-CM

## 2011-04-07 DIAGNOSIS — K7460 Unspecified cirrhosis of liver: Secondary | ICD-10-CM

## 2011-04-07 DIAGNOSIS — B181 Chronic viral hepatitis B without delta-agent: Secondary | ICD-10-CM

## 2011-04-07 LAB — HEPATIC FUNCTION PANEL
ALT: 25 U/L (ref 0–53)
AST: 39 U/L — ABNORMAL HIGH (ref 0–37)
Bilirubin, Direct: 0.6 mg/dL — ABNORMAL HIGH (ref 0.0–0.3)

## 2011-04-07 LAB — HEPATITIS B SURFACE ANTIBODY,QUALITATIVE: Hep B S Ab: NEGATIVE

## 2011-04-11 LAB — HEPATITIS B E ANTIBODY: Hepatitis Be Antibody: POSITIVE — AB

## 2011-04-11 LAB — HEPATITIS B E ANTIGEN: Hepatitis Be Antigen: NEGATIVE

## 2011-04-11 LAB — HEPATITIS C RNA QUANTITATIVE: HCV Quantitative: NOT DETECTED IU/mL (ref <43)

## 2011-04-11 LAB — HEPATITIS B DNA, ULTRAQUANTITATIVE, PCR: Hepatitis B DNA (Calc): 361 copies/mL — ABNORMAL HIGH (ref <116)

## 2011-04-14 ENCOUNTER — Encounter: Payer: Self-pay | Admitting: Gastroenterology

## 2011-04-14 NOTE — Progress Notes (Addendum)
NAMETAREQ, DWAN    MR#:  [id number]      DATE:  04/07/2011  DOB:  01/03/1966    cc: Consulting Physician:  Marcellus Scott, MD, [facility], [address], [address], [place], [place] [address], [place] [phone]  Primary Care Physician:  Same.  Referring Physician:  [name], FNP, c/o Louanna Raw, MD, [facility], [address] [place], [place] [address], Fax [phone]  [name], MD, [facility], [address], [place], [place] [address], Fax [phone]     [facility] medical record number [id number]   REASON FOR VISIT:  Follow up of E antibody positive, genotype unknown inactive hepatitis B and, and genotype 1a hepatitis C treatment week 24 for hepatitis C.   History:  The patient returns today unaccompanied. Since last being seen he has not developed any peripheral edema or abdominal distention. He has lost another 6 pounds since last being seen. He denies any symptoms of  encephalopathy. There has never been history of variceal bleeding. He reports compliance with his diuretics.  In terms of his hepatitis C therapy, he denies any psychiatric symptoms or rash. He continues to have his lab work done through [facility].   PAST MEDICAL HISTORY:  No interval change.   CURRENT MEDICATIONS:  Ribavirin 400 mg a.m. and 200 mg p.m. dose reduced for anemia, boceprevir 800 mg q. 8 hours, Pegasys 180 mcg weekly, omeprazole 20 mg daily, Oxycodone 5 mg q. 4 hours p.r.n. for arthralgias,  spironolactone 100 mg daily, furosemide 40 mg daily, aspirin 81 mg daily, and Nasonex p.r.n.   ALLERGIES:  Flagyl causes erythema.   HABITS:  Smoking quit in 2000. Alcohol denies interval consumption.   REVIEW OF SYSTEMS:  All 10 systems reviewed today with the patient and they are negative other than the continued complaint of epigastric discomfort, which was  particularly pronounced when  twisting at work. Otherwise all 10 systems were negative. His CES-D was 25.   PHYSICAL EXAMINATION:   Constitutional:  Stated age. There is some element of central obesity.   Vital signs:  Height 66 inches, weight 209 pounds, blood pressure 104/67, pulse of 86, temperature 97.2 Fahrenheit.  Ears, nose, mouth and throat:  Unremarkable oropharynx.  No thyromegaly or neck masses.   Chest:  Resonant to percussion.  Clear to auscultation.  Cardiovascular:  Heart sounds normal S1, S2 without murmurs or rubs.  There is no peripheral edema.  Abdominal:  Normal bowel sounds.  No  masses or tenderness.  I could not appreciate a liver edge or spleen tip.  I could not appreciate any hernias.  Lymphatics:  No cervical or inguinal lymphadenopathy.  Central Nervous System:  No asterixis or  focal neurologic findings.  Dermatologic:  Anicteric without palmar erythema or spider angiomata.  Eyes:  Anicteric sclerae.  Pupils are equal and reactive to light.   ASSESSMENT:  The patient is a 56 year old gentleman with history of genotype 1a hepatitis C. Currently week 24 of therapy, IL 28 B status unknown. 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 He is currently week 24 of pegylated interferon, ribavirin and boceprevir. His week 8 HCV RNA was undetectable. Because of  cirrhosis, he will need a full 48 weeks of therapy. 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  In terms of cirrhosis care, he is  doing very well on combination of furosemide and spironolactone for his edema. There is no history of variceal bleeding and there is no evidence of thrombocytopenia. He  does not need a screening endoscopy. There is no encephalopathy. An ultrasound on 02/07/2011, did not show any evidence of cancer and it needs to be repeated by May 2013. He is hepatitis A immune by testing  and his hepatitis B is  suppressed, and does not need vaccination or treatment at this time.  In my discussion today with the patient, we discussed his progress to date.   PLAN:  1. I will do his week 24 HCV RNA today. 2. Will check a hepatitis B panel today. In addition, to liver enzymes and a TSH. 3. There is no indication for an endoscopy.  4. He will need another ultrasound in May 2013. 5. He is hepatitis A immune. 6. I instructed him to present to [facility] for CBC every other week now because of stability of his CBC. 7. He will return to see me in 4 weeks' time.            [name], MD   ADDENDUM Week 24 HCV viral load undetectable.  HBV DNA 62 IU/mL e Ab positive.  [id number] .[id number]  D:  Thu Jan 10 14:56:11 2013 ; T:  Thu Jan 10 19:45:44 2013  Job #:  [id number]

## 2011-05-05 ENCOUNTER — Ambulatory Visit (INDEPENDENT_AMBULATORY_CARE_PROVIDER_SITE_OTHER): Payer: BC Managed Care – PPO | Admitting: Gastroenterology

## 2011-05-05 DIAGNOSIS — B181 Chronic viral hepatitis B without delta-agent: Secondary | ICD-10-CM

## 2011-05-05 DIAGNOSIS — B182 Chronic viral hepatitis C: Secondary | ICD-10-CM

## 2011-05-05 DIAGNOSIS — K7460 Unspecified cirrhosis of liver: Secondary | ICD-10-CM

## 2011-05-05 NOTE — Patient Instructions (Signed)
1.  Please draw a CMP and CBC with diff in two weeks from now at [facility] and fax the results to [phone] (if problems with labs you can also fax to [phone].  Diagnoses are 070.54  070.32  571.5

## 2011-05-12 NOTE — Progress Notes (Signed)
NAMESELVIN, YUN    MR#:  [id number]      DATE:  05/05/2011  DOB:  December 28, 1955    cc: Harl Favor, MD, [facility] [address], [address],  [place], [place] , [address], fax number [phone].   [name], MD, [facility], [address], [place], [place], [address], fax number [phone]   [name], FNP, c/o Louanna Raw, MD,  [facility], [address], [place], [place] [address], fax number [phone].    [facility] medical record number:  [id number].    Reason visit:  Follow up of E antibody positive, genotype unknown, inactive hepatitis B and genotype 1A hepatitis C treatment week 28 of therapy.    History:  The patient returns today unaccompanied. Since last being seen on 04/07/2011 he has lost another 11 pounds. He complains of significant fatigue and some nausea, which can interfere with eating at times. He  has no peripheral edema or ascites. There is no shortness of breath. He has noticed some jaundice at times. There is no symptoms of encephalopathy. There have been no interval episodes of GI bleeding.  For the nausea, [name] has given him ondansetron which he finds effective.   PAST MEDICAL HISTORY:  No interval change.   CURRENT MEDICATIONS:  Ribavirin 400 mg a.m., 200 mg p.m., dose reduced for anemia. Boceprevir 800 mg  every 8 hours, Pegasys 180 mcg weekly, omeprazole 20 mg daily, oxycodone 5 mg every 4 hours for arthralgias,  spironolactone 100 mg daily, furosemide 40 mg daily, aspirin 81 mg daily, Nasonex p.r.n.  Ondansetron 4 mg every 8h p.r.n.   ALLERGIES:  Flagyl causes erythema.    Habits:  Smoking, quit in 2000. Alcohol, denies interval consumption.   REVIEW OF SYSTEMS:  All 10 systems reviewed today with the patient and are negative other which is mentioned above. CES-D was not completed.   PHYSICAL EXAMINATION:   Constitutional:  Stated age. There is some central obesity, which is significantly improved since he started on therapy.   Vital signs:  Height 67 inches, weight 98 pounds. Blood pressure is 86/72, pulse of 80, temperature of 97.8 Fahrenheit.   LABORATORY STUDIES:  From [facility] on 05/02/2011. CBC, white count 4.4, hemoglobin 11.3, MCV 105.2, platelet count of 139. Sodium is 128 with a slightly high glucose of 124. His creatinine was 1.12. Total  bilirubin was 3.0, ALP 106, AST 38, ALT 32, albumin 3.7, globulins of 3.2.  TSH was 1.767.   ASSESSMENT:  The patient is a 56 year old gentleman with history of genotype 1A hepatitis C. He is currently week 28 of therapy with 28B status is unknown. However, because of a biopsy on 07/2010, showing macro  steatosis and cirrhosis he is committed to 48 weeks of treatment.  His week 8 HCV RNA and week 12 HCV RNA  and week 24 HCV RNA were undetectable, but again because of his cirrhosis is committed to a 40  week course of therapy.   In terms of hepatitis B, at his previous visit on 04/07/2011 his HBV DNA was 62 international units/mL and he remained E antibody positive. This indicates an inactive state which does not require therapy.  In terms of his cirrhosis care, he is doing well on a combination of furosemide and spironolactone for edema. There is no history of variceal bleeding nor is there evidence of thrombocytopenia to warrant  an endoscopy. There is no  encephalopathy. Ultrasound on 02/07/2011 did not show any evidence of HCC and this needs to be repeated by 2013. He is hepatitis A immune by testing and his B is suppressed. He does not  need vaccinations for either at this time.  I note that he has an elevated bilirubin today but this has been noted before on 04/07/2011, his total bilirubin was 2.3, of which the indirect was 1.7. 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 I do not  get the sense that he is significantly decompensated in need of consideration for transplant despite the fact that he  has cirrhosis.  In my discussion today with the patient we discussed his progress today. Discussed his jaundice and I have explained to him I think this is from hemolysis. We discussed continuing on treatment.   PLAN:  1. He will continue with labs in 2 weeks' time at [facility], and these will be faxed to me. I  have given written instructions, asking for him to have a CMP and a CBC with differential with the results faxed to both [facility] and [facility]. 2. I have asked him to avoid NSAIDs for any complaints of arthralgias. 3. I have told he can continue with the ondansetron as he was seeking my approval for its use. 4. He is to return in 4 weeks' time at approximately week 32 of a planned 48 weeks of hepatitis C treatment.            [name], MD   [phone]  D:  Thu Feb 07 20:17:34 2013 ; T:  Sat Feb 09 10:17:13 2013  Job #:  [id number]

## 2011-06-02 ENCOUNTER — Ambulatory Visit (INDEPENDENT_AMBULATORY_CARE_PROVIDER_SITE_OTHER): Payer: BC Managed Care – PPO | Admitting: Gastroenterology

## 2011-06-02 DIAGNOSIS — K7460 Unspecified cirrhosis of liver: Secondary | ICD-10-CM

## 2011-06-02 DIAGNOSIS — B182 Chronic viral hepatitis C: Secondary | ICD-10-CM

## 2011-06-02 NOTE — Progress Notes (Signed)
NAMEKENNETH, CUARESMA  MR#: [id number]      DATE: 05/05/2011  DOB: March 07, 1956    cc:  Marcellus Scott, MD, [facility] [address], [place], [place] , [address], fax number [phone].   [name], MD, [facility], [address], [place], [place], [address], fax number [phone]   [name], FNP, c/o Louanna Raw, MD, [facility], [address], [place], [place] [address], fax number [phone].    [facility] MEDICAL RECORD NUMBER:  [id number].   REASON VISIT:  Follow up of E antibody positive, genotype unknown, inactive hepatitis B and genotype 1A hepatitis C treatment week 31 of therapy.   HISTORY:  The patient returns today unaccompanied. Since last being seen on 05/05/2011, he has lost 9 pounds.  He has no peripheral edema or ascites. There is no shortness of breath. There are no symptoms of encephalopathy. There have been no interval episodes of GI bleeding, nor has he ever bled from varices.  He denies psychiatric symptoms from his HCV therapy.  PAST MEDICAL HISTORY:  No interval change.   CURRENT MEDICATIONS:  Ribavirin 400 mg a.m., 200 mg p.m., dose reduced for anemia. Boceprevir 800 mg every 8 hours, Pegasys 180 mcg weekly, omeprazole 20 mg daily, oxycodone 5 mg every 4 hours for arthralgias,  spironolactone 100 mg daily, furosemide 40 mg daily, aspirin 81 mg daily, Nasonex p.r.n., Ondansetron 4 mg every 8h p.r.n.   ALLERGIES:  Flagyl causes erythema.   HABITS:  Smoking, quit in 2000. Alcohol, denies interval consumption.   REVIEW OF SYSTEMS:  All 10 systems reviewed today with the patient and are negative other which is mentioned above. CES-D was 22.    PHYSICAL EXAMINATION:  Constitutional: Stated age. There is some central obesity, which is significantly improved since he started on therapy.  Vital signs: Height 67 inches, weight  189 pounds. Blood pressure is 92/72, pulse of 74, temperature of 98.5 Fahrenheit.  ENT:  Unremarkable oropharynx with dentures.  No neck masses or thyromegaly. CHEST: Resonant to percussion and clear to auscultation. CVS:  HS normal S1 S2.  No edema. ABD: BS present.  Nontender.  No masses.  I could not feel a liver edge or spleen tip.  No ascites. CNS: No encephalopathy. DERM: Slightly icteric. No palmar erythema. EYES: Anicteric sclera.  LABORATORY STUDIES:  From [facility] on 05/30[facility]2011. CBC, white count 3.1, hemoglobin 10.7, MCV 109.1, platelet count of 142. Sodium is 131 with a slightly high glucose of 101. His creatinine was 1.78. Total  bilirubin was 2.0, ALP 89, AST 39, ALT 24, albumin 3.5, globulins of 3.1.   ASSESSMENT:  The patient is a 56 year old gentleman with history of genotype 1A hepatitis C, IL28B unknown.  However, because of a biopsy on 07/2010, showing macro steatosis and cirrhosis he is committed to 48 weeks of treatment.  His start date was 10/22/10. He is currently at week 31 of therapy.   His week 8 HCV RNA and week 12 HCV RNA and week 24 HCV RNA were undetectable, but again because of his cirrhosis is committed to a 48 week course of therapy.   In terms of hepatitis B, at a previous visit on 04/07/2011 his HBV DNA was 62 international units/mL and he remained E antibody positive. This indicates an inactive state which does not require therapy.  In terms of his cirrhosis care, he may be overdiuresed with furosemide and spironolactone for  edema, with the rise in his creatinine and drop in his weight though his BUN is only 19.  There is no history of variceal bleeding nor is there evidence of thrombocytopenia to warrant an endoscopy. There is no encephalopathy. Ultrasound on 02/07/2011 did not show any evidence of HCC and this needs to be repeated by 07/2011. He is hepatitis A immune by testing and his B is suppressed. He does not need vaccinations for either at  this time.  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 I do not get the sense that he is significantly decompensated in need of consideration for transplant despite the fact that he has cirrhosis.  In my discussion today with the patient we discussed his progress.  I discussed dose reductions in his spironolactone and furosemide.  PLAN:  1. He will continue with labs in 2 weeks' time at [facility], and these will be faxed to me.  I asked for a PT/INR with a CMP and CBC with diff. 2. I have asked him to avoid NSAIDs for any complaints of arthralgias.       3.  Reduce the spironolactone (otherwise called Aldactone) to 50 mg daily from 100 mg daily.       4.  Reduce the furosemide (otherwise called Lasix) to 20 mg daily from 40 mg daily.       5.  If he gets more swelling he is to call [name] and/or me at [phone].       6.  Repeat HBV serologies in April 2013.       7.  He is to return in 4 weeks' time at approximately week 32 of a planned 48 weeks of hepatitis C treatment.            [name], MD

## 2011-07-01 ENCOUNTER — Ambulatory Visit (INDEPENDENT_AMBULATORY_CARE_PROVIDER_SITE_OTHER): Payer: BC Managed Care – PPO | Admitting: Gastroenterology

## 2011-07-01 DIAGNOSIS — K7460 Unspecified cirrhosis of liver: Secondary | ICD-10-CM

## 2011-07-01 DIAGNOSIS — B181 Chronic viral hepatitis B without delta-agent: Secondary | ICD-10-CM

## 2011-07-01 DIAGNOSIS — B182 Chronic viral hepatitis C: Secondary | ICD-10-CM

## 2011-07-01 MED ORDER — SPIRONOLACTONE 50 MG PO TABS: 50.0000 mg | ORAL_TABLET | Freq: Every day | ORAL | Status: DC

## 2011-07-01 MED ORDER — FUROSEMIDE 20 MG PO TABS: 20.0000 mg | ORAL_TABLET | Freq: Every day | ORAL | Status: DC

## 2011-07-01 MED ORDER — LACTULOSE 10 GM/15ML PO SOLN: 20.0000 g | Freq: Two times a day (BID) | ORAL | Status: AC

## 2011-07-01 NOTE — Progress Notes (Signed)
Francisco Floyd, Francisco Floyd  MR#: [id number]      DATE: 004/07/2011  DOB: August 28, 1955    cc:  Marcellus Scott, MD, [facility] [address], [address], [place], [place] , [address], fax number [phone].   [name], MD, [facility], [address], [place], [place], [address], fax number [phone]   [name], FNP, c/o Louanna Raw, MD, [facility], [address], [place], [place] [address], fax number [phone].    [facility] MEDICAL RECORD NUMBER:  [id number].   REASON VISIT:  Follow up of E antibody positive, genotype unknown, inactive hepatitis B and genotype 1A hepatitis C treatment week 36 of therapy.   HISTORY:  The patient returns today unaccompanied. Since last being seen on 06/02/2011, he has lost another 4 pounds.  He rand out of his diuretics last Sunday and has been without for the past four days.  He notices slight peripheral edema but no ascites. There is no shortness of breath. In terms of encephalopathy, he complains of difficulty with concentration as usual.   Mr. Kingry describes what may have been an ammonia level being drawn at [facility], which he thought was normal.  He thinks it is worse since he stopped his diuretics. There have been no interval episodes of GI bleeding, nor has he ever bled from varices. He denies psychiatric symptoms from his HCV therapy.   PAST MEDICAL HISTORY:  No interval change.   CURRENT MEDICATIONS:  Ribavirin 400 mg a.m., 200 mg p.m., dose reduced for anemia. Boceprevir 800 mg every 8 hours, Pegasys 180 mcg weekly, omeprazole 20 mg daily, oxycodone 5 mg every 4 hours for arthralgias,  spironolactone  50 mg daily (off for 5 days), furosemide 20 mg daily (off for 5 days), aspirin 81 mg daily, Nasonex p.r.n., Ondansetron 4 mg every 8h p.r.n.   ALLERGIES:  Flagyl causes erythema.   HABITS:  Smoking, quit in 2000. Alcohol,  denies interval consumption.   REVIEW OF SYSTEMS:  All 10 systems reviewed today with the patient and are negative other which is mentioned above. CES-D was 35.   PHYSICAL EXAMINATION:  Constitutional: Stated age. His central obesity is significantly improved since he started on therapy.  Vital signs: Height 67 inches, weight 185 pounds. Blood pressure is 104/72, temperature of 96.7 Fahrenheit.  ENT: Unremarkable oropharynx with dentures. No neck masses or thyromegaly.  CHEST: Resonant to percussion and clear to auscultation.  CVS: HS normal S1 S2. Trivial edema.  ABD: BS present. Nontender. No masses. I could not feel a liver edge or spleen tip. No ascites.  CNS: No encephalopathy.  DERM: Slightly icteric. No palmar erythema.  EYES: Anicteric sclera.   LABORATORY STUDIES:  From [facility] on 05/30/2011. CBC, white count 3.1, hemoglobin 10.7, MCV 109.1, platelet count of 142. Sodium is 131 with a slightly high glucose of 101. His creatinine was 1.78. Total  bilirubin was 2.0, ALP 89, AST 39, ALT 24, albumin 3.5, globulins of 3.1.   Labs on 06/27/11, WBC 3.5  ANC 1.9, HgB 10.4 Plts 110.  Sodium 133.  Creat 1.09.  Total bili 1.9.   Alb 3.4.  ASSESSMENT:  The patient is a 56 year old gentleman with history of genotype 1A hepatitis C, IL28B unknown. However, because of a biopsy on 07/2010, showing macro steatosis and cirrhosis he is committed to 48 weeks of treatment. His start date was 10/22/10. He is currently at week 36 of therapy. His week  8 HCV RNA and week 12 HCV RNA and week 24 HCV RNA were undetectable, but again because of his cirrhosis is committed to a 48 week course of therapy.  In terms of hepatitis B, it was previously in an inactive state which did not require therapy.  It is time to repeat this.  In terms of his cirrhosis care, with his slight edema, he may need the smaller dose of diuretics started at his last appointment.  There is no history of variceal bleeding  nor is there evidence of thrombocytopenia to warrant an endoscopy. His complaint of poor concentration at work may be subtle encephalopathy, though I am not sure. Ultrasound on 02/07/2011 did not show any evidence of HCC and this needs to be repeated by 07/2011. He is hepatitis A immune by testing and his B is suppressed. He does not need vaccinations for either at this time.  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 I do not get the sense that he is significantly decompensated in need of consideration for transplant despite the fact that he has cirrhosis.  In my discussion today with the patient we discussed his progress. I discussed dose reductions in his spironolactone and furosemide.   PLAN:  1. He will continue with labs in 2 weeks' time at [facility], and these will be faxed to me. I would like a PT/INR with a CMP and CBC with diff. 2. HBV labs today. 3. Renewed spironolactone (otherwise called Aldactone) 50 mg daily, 30, 5 refills.  4. Renewed furosemide (otherwise called Lasix)  20 mg daily, 30, 5 refills.  5. If he gets more swelling he is to call [name] and/or me at [phone].  6. Order ultrasound at next appointment. 7. No indication for EGD. 8. For the concentration difficulty, ordered Lactulose 20 gm BID, 1892 mL, 5 refills, with instructions to titrate to 2 - 3 bowel movements per day. 9. He is to return in 4 weeks' time at approximately week 40 of a planned 48 weeks of hepatitis C treatment.   [name], MD   ADDENDUM: Hepatitis B serologies not done.

## 2011-07-14 ENCOUNTER — Telehealth: Payer: Self-pay | Admitting: Gastroenterology

## 2011-07-14 NOTE — Telephone Encounter (Signed)
For the HgB of 9.2 from 07/08/11, I have asked [name] NP to have Mr Murad in for another CBC the week of 07/18/11 and fax the results to [phone], by faxing this note.

## 2011-07-21 ENCOUNTER — Ambulatory Visit (INDEPENDENT_AMBULATORY_CARE_PROVIDER_SITE_OTHER): Payer: BC Managed Care – PPO | Admitting: Gastroenterology

## 2011-07-21 DIAGNOSIS — B181 Chronic viral hepatitis B without delta-agent: Secondary | ICD-10-CM

## 2011-07-21 DIAGNOSIS — K7460 Unspecified cirrhosis of liver: Secondary | ICD-10-CM

## 2011-07-21 DIAGNOSIS — B182 Chronic viral hepatitis C: Secondary | ICD-10-CM

## 2011-07-21 NOTE — Patient Instructions (Addendum)
1) Next week and weekly go to [facility] for the following lab: CBC with diff and fax result to [phone]  (if there are panic labs then can fax them ALSO to [phone])  Diagnosis is 070.54.

## 2011-07-22 LAB — HEPATITIS B DNA, ULTRAQUANTITATIVE, PCR

## 2011-07-22 LAB — IBC PANEL: TIBC: 228 ug/dL (ref 215–435)

## 2011-07-22 LAB — IRON: Iron: 138 ug/dL (ref 42–165)

## 2011-07-25 LAB — HEPATITIS B E ANTIBODY: Hepatitis Be Antibody: POSITIVE — AB

## 2011-07-25 LAB — HEPATITIS B E ANTIGEN: Hepatitis Be Antigen: NEGATIVE

## 2011-07-26 ENCOUNTER — Telehealth: Payer: Self-pay | Admitting: Gastroenterology

## 2011-07-26 NOTE — Telephone Encounter (Signed)
Dr Jacqualine Mau, This patient from [facility] left a message on my v/m stating that his feet are swelling and he is having more bleeding than usual.  He doubled the dose of diuretics he is taking and wants to know if he did the right thing.  [phone] thanks  Francisco Floyd Administrative Assistant to: [name], MD, MPH Evlyn Courier, MD Rae Mar, PhD Tina Griffiths, [facility] [address] [place], [place] [address] [phone]  I spoke with Francisco Floyd who reported that he increased his furosemide to 40 mg daily and increased his spironolactone to 100 mg daily on 07/23/11, after developing more edema and some difficulty with concentration.  His edema has improved somewhat and his concentration is better.  I reminded him that he was anemic which could be contributing to some of his symptoms but we agreed to not add the erythropoietin unless his anemia worsened.  He was going to [facility] for labs.  I instructed him to obtain a CMP, CBC, PT/INR and that these should be faxed to [facility] at [phone] (and [place] [phone] to be sure I get them).  He wrote the instructions down and will go tomorrow.  I will also forward this note to Ms. York.

## 2011-07-28 NOTE — Progress Notes (Signed)
NAMEMarland Floyd  REGNALD, BOWENS    MR#:  [id number]      DATE:  07/21/2011  DOB:  02/04/1956    cc: Consulting Physician: Elizbeth Squires, MD, [facility], [address], [place], [place] [address], [place] [phone]  Primary Care Physician:  Same.  Referring Physician: [name], FNP, c/o Louanna Raw, MD, [facility], [address], [place], [place] [address], Fax  [phone]    [facility] Medical record number: [id number].   REASON FOR VISIT: Follow up of E antibody positive, genotype unknown, inactive hepatitis B and genotype 1a hepatitis C, treatment week 39 of HCV therapy.   HISTORY:  The patient returns unaccompanied. He is now out of work pending approval of short-term disability until July 2013, because of difficulty with concentration at work. He denies any peripheral edema or abdominal distention. It will be recalled that we restarted his diuretics the last time I saw him after having gone without them for 5 days and developing some peripheral edema. He denies any ascites or peripheral edema or shortness of breath. There are no symptoms of encephalopathy, but he does complain of difficulty with concentration as previous. He has not had any interval episodes of gastrointestinal bleeding nor has he ever bled from varices. He denies any symptoms directly referable to his history of hepatitis C therapy including psychiatric symptoms.   PAST MEDICAL HISTORY:  No interval change.   CURRENT MEDICATIONS: 1. Ribavirin 400 mg a.m., 200 mg p.m. dose reduced for anemia.   2. Boceprevir 800 mg every 8 hours. 3. Pegasys 180 mcg weekly.  4. Omeprazole 20 mg daily.  5. Oxycodone 5 mg every 4 hours for arthralgias. 6. Spironolactone 50 mg daily.  7. Furosemide 20 mg daily.  8. Aspirin 81 mg daily.  9. Nasonex p.r.n.  10. Ondansetron 4 mg q.8 hours p.r.n.   ALLERGIES:  FLAGYL causes erythema.   Habits:  Smoking, quit in 2000. Alcohol denies  interval consumption.   REVIEW OF SYSTEMS:  All 10 systems reviewed today with the patient and they are negative other than which was mentioned above. His CES-D was 26.   PHYSICAL EXAMINATION:  Constitutional: Pallor but appeared less jaundiced than previous.  Vital Signs: Height 66 inches, weight 179 pounds, down from 185 pounds previously, blood pressure was 89/55, pulse 62, temperature 96.9 Fahrenheit.   LABORATORIES:  Previous labs from 07/19/2011, white count 1.4, ANC 0.6, hemoglobin 9.0. MCV 115.2, platelets 90. Sodium was 135, potassium 3.9, creatinine 1.06, AST 38, ALT 18, ALP 86, total bilirubin 1.2, albumin 2.9 with globulins of 2.7.   ASSESSMENT:  The patient is a 56 year old gentleman with history of genotype 1a hepatitis C, IL28B unknown. Biopsy in May 2012, showed macrosteatosis and cirrhosis. He is therefore committed to 48 weeks of treatment for his genotype 1a hepatitis C. He completed approximately 39 weeks of therapy. His HCV at week 8, 12, 24 were all undetectable. This is favorable but because he is cirrhotic, he will need to be treated for 48 weeks.   In terms of his hepatitis B, the serologies I asked for at the last clinic appointment were not done so, they will need to be done again today.   In terms of his anemia, I have dose reduced his ribavirin in the past, but I do not want to dose reduce this any further nor do I want to dose reduce his interferon. Although he mentions he is practically asymptomatic from this. I think it would be  prudent to start the process of getting authorization done for erythropoietin as he has 2 more months left of therapy, and this may worsen before he stops treatment. We will need to complete the prior authorization process for [facility] [place] [place].   In terms of his cirrhosis care, he seems to be doing quite well on low dose diuretics. There is no significant encephalopathy. There is no history of variceal bleeding. There is  no thrombocytopenia to warrant a screening endoscopy. He is due for another ultrasound for hepatocellular cancer [facility]) screening around now into May 2013. He is hepatitis A immune by testing and his B is suppressed.   In terms of his relative neutropenia from his CBC from 07/19/2011, I do not consider that significant enough to use GCSF to boost his white count unless his ANC was well under 0.5.   In my discussion today with the patient, we reviewed his recent lab work to date including the improvement in his bilirubin. We discussed his hemoglobin and the need to potentially use erythropoietin. I reviewed the pros and cons of doing so, including the risk of stroke and aplastic anemia. We discussed the importance of having his CBC monitored on treatment if I am to add erythropoietin.   PLAN:  1. Hepatitis B serologies today.  2. I asked him to present to [facility] on a weekly basis for CBC results to be faxed to our clinic here and to [facility].  3. Hepatitis A immune.  4. Will order an ultrasound at this appointment.  5. There is no indication for repeat esophagogastroduodenoscopy at this time unless he develops thrombocytopenia.  6. He will return in approximately 4 weeks' time, for week approximately 43-44.  7. I will start the process of prior authorization for erythropoietin  through [facility] [place].               [name], MD    ADDENDUM:  Authorization not needed for erythropoietin but prescription will need to be sent to [facility].    HBV e Ab positive.  HBV DNA undetectable.  07/27/11 HgB 9.6, so will hold on erythropoietin.  MELD 12.   [phone]  D:  Thu Apr 25 16:52:27 2013 ; T:  Thu Apr 25 21:00:04 2013  Job #:  [id number]

## 2011-08-18 ENCOUNTER — Ambulatory Visit (INDEPENDENT_AMBULATORY_CARE_PROVIDER_SITE_OTHER): Payer: BC Managed Care – PPO | Admitting: Gastroenterology

## 2011-08-18 DIAGNOSIS — K7460 Unspecified cirrhosis of liver: Secondary | ICD-10-CM

## 2011-08-18 DIAGNOSIS — B181 Chronic viral hepatitis B without delta-agent: Secondary | ICD-10-CM

## 2011-08-18 DIAGNOSIS — B182 Chronic viral hepatitis C: Secondary | ICD-10-CM

## 2011-08-18 MED ORDER — FUROSEMIDE 40 MG PO TABS: 40.0000 mg | ORAL_TABLET | Freq: Every day | ORAL | Status: DC

## 2011-08-18 MED ORDER — SPIRONOLACTONE 100 MG PO TABS: 100.0000 mg | ORAL_TABLET | Freq: Every day | ORAL | Status: DC

## 2011-08-18 NOTE — Patient Instructions (Addendum)
1. Increase the spironolactone back to 100 mg a day 2. Increase the furosemide back to 40 mg a day 3.  Drop the ribavirin to 1 pill in the morning and 1 pill in the evening for now 4.  I will be ordering Procrit from [facility].  It is used to treat your anemia.  You should take it like the Pegasys injection in a separate part of your stomach but you can take it at the same time. 5.  Continue with going weekly to [facility] for blood tests. 6.  Go tomorrow to [facility] for an ultrasound of your liver. 7.  Will see you in about 2 - 4 weeks.

## 2011-08-19 ENCOUNTER — Ambulatory Visit (HOSPITAL_COMMUNITY)
Admission: RE | Admit: 2011-08-19 | Discharge: 2011-08-19 | Disposition: A | Discharge status: Home / Self Care | Payer: BC Managed Care – PPO | Source: Ambulatory Visit | Attending: Gastroenterology | Admitting: Gastroenterology

## 2011-08-19 DIAGNOSIS — B182 Chronic viral hepatitis C: Secondary | ICD-10-CM

## 2011-08-19 DIAGNOSIS — N281 Cyst of kidney, acquired: Secondary | ICD-10-CM | POA: Insufficient documentation

## 2011-08-19 DIAGNOSIS — K7460 Unspecified cirrhosis of liver: Secondary | ICD-10-CM

## 2011-08-19 DIAGNOSIS — B181 Chronic viral hepatitis B without delta-agent: Secondary | ICD-10-CM

## 2011-08-25 NOTE — Progress Notes (Signed)
NAMEMarland Kitchen  Francisco Floyd, Francisco Floyd    MR#:  [id number]      DATE:  08/18/2011  DOB:  [birth year]    cc: Consulting Physician: [name], MD, [facility], [address], [place], [place], [address], [place] [phone]  Primary Care Physician: Same  Referring Physician: [name], FNP, c/o Louanna Raw, MD, [facility], [address] [place], [place] [address], Fax [phone]    [facility] medical record [phone]    REASON FOR VISIT: Follow up of E antibody positive, genotype unknown, inactive hepatitis B and genotype 1a hepatitis C, on treatment week 44 of therapy.   The patient returns today with increasing peripheral edema, though he denies any abdominal distention or shortness of breath. He reports that there may have been some sodium indiscretion in that he likes eating soup and he cannot find soups that have low enough sodium to his satisfaction. It also should be recalled that because he responded very nicely to diuretics in the past, we halved his diuretics. He now asks if they can be increased to the previous dose.  He complains of significant difficulty with concentration and fatigue, but denies day/night reversal, and there has been no hospital visits to suggest significant encephalopathy. There has been no interval episodes of gastrointestinal bleeding, and in fact he has never bled from varices. He is due to have a surveillance ultrasound tomorrow in [place], but asked if this could be moved to [place].   PAST MEDICAL HISTORY:  No interval change.   CURRENT MEDICATIONS:  1. Ribavirin 400 mg a.m., 200 mg p.m. dose reduced for anemia. 2. Boceprevir 800 mg q. 8 hours.   3. Pegasys 180 mcg weekly.  4. Omeprazole 20 mg daily.  5. Oxycodone 5 mg every 4 hours for arthralgias. 6. Spironolactone 50 mg daily.  7. Furosemide 20 mg daily.  8. Aspirin 81 mg daily.  9. Nasonex p.r.n.  10. Ondansetron 8 mg q.8 hours p.r.n.   11. Escitalopram 5 mg daily.  12. Bystolic 5 mg daily.  13. Dulera inhaler. 14. 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   ALLERGIES:  FLAGYL causes erythema.   HABITS:  Smoking quit in 2000. Alcohol denies interval consumption.   REVIEW OF SYSTEMS:  All 10 systems reviewed today with the patient and they are negative other than which is mentioned above. His CES-D was 25.   PHYSICAL EXAMINATION:  Constitutional: Pallor but otherwise no significant peripheral wasting. Vital signs: Height 66 inches, weight 188 pounds up from 179 previously, blood pressure 94/59, pulse of 80, temperature 96.7 Fahrenheit. Ears, nose, mouth and throat: Unremarkable oropharynx. No thyromegaly or neck masses. Chest resonant to percussion. Clear to auscultation. Cardiovascular: Heart sounds normal S1, S2 without murmurs or rubs. There is peripheral edema bilaterally. Abdomen: Normal bowel sounds. No masses or tenderness. Could not appreciate liver edge or spleen tip. There is no ascites by my estimation. Lymphatics: No cervical or inguinal lymphadenopathy. CENTRAL NERVOUS SYSTEM: No asterixis or focal neurologic findings. DERMATOLOGIC: There was slight palmar erythema. The eyes with anicteric sclerae.   LABORATORIES:  Lab work from 08/15/2011; CBC: White count 2.2, hemoglobin 8.2, MCV 107.1, platelet count 79, ANC 1.0. Electrolytes were normal with a sodium 135, potassium 3.9, creatinine was 0.93, total bilirubin was 0.9, ALP 101, AST 37, ALT 13, albumin 2.6.   On 08/09/2011, His hemoglobin was 8.4.   ASSESSMENT:  The patient is a 56 year old gentleman with history of genotype 1a hepatitis C, IL28B unknown, biopsy in 08/25/2010,  showing macrosteatosis and cirrhosis. He is committed to 48 weeks of therapy with genotype 1a hepatitis C. He has completed approximately 44 weeks of therapy. His week 4, 8, 12, and 24 HCV RNA were undetectable. He has required dose reduction of ribavirin because of significant anemia. I think  that he continues to be anemic despite this and it is now time to initiate erythropoietin.   In terms of his hepatitis B, his serologies when last seen on 07/21/2011, showed that his HBV DNA was undetectable. He remains E antibody positive. There is no indication for treatment of this at this time. I think his liver disease is compensated enough to tolerate the interferon for his hepatitis C without detrimental effects on the course of his hepatitis B.   In terms of his cirrhosis care, he needs more diuretics. It is possible that the worsening edema could be exacerbated by anemia. In the past, I did not think there was significant evidence proteinuria to suggest that as an etiology for his edema and furthermore his virus is responding to treatment, so this is unlikely to be cryoglobulin mediated MPGN with proteinuria leading to edema. There is no significant encephalopathy. I suspect his fatigue is due to his anemia. There is no history of variceal bleeding. There is no thrombocytopenia to warrant a screening endoscopy. He is due for screening ultrasound tomorrow and wishes to have this moved to [place] for his convenience. He is hepatitis A immune by testing and his B is suppressed.   In my discussion today with the patient, we discussed adjusting diuretics upwards because of edema. We discussed dropping his ribavirin dosing until we can initiate erythropoietin. We discussed erythropoietin and its side effects and benefits.   PLAN:   1. He is to continue with weekly CBCs through [facility] in [place].  2. He is hepatitis A immune.  3. Ultrasound to be done tomorrow in [place]. I have given him a  written prescription for this, and asked our office to cancel this one in [place]. 4. No indication for repeat endoscopy at this time, unless he develops thrombocytopenia off therapy.  5. I have written a prescription for erythropoietin 60,000 units weekly for 4 more weeks until the completion of  therapy and faxed this to [facility].  6. In the interim, he will have dose reduced his ribavirin to 200 mg b.i.d.  7. He is to increase his spironolactone to 100 mg daily.  8. Increase his Furosemide to 40 mg daily.  9. He will return in approximately 2 weeks' time at which time will ask for a repeat electrolytes, BUN, creatinine through [place], to add to his weekly CBCs.  10. I still anticipate completing therapy of his hepatitis C in 4 weeks' time.               [name], MD   [phone]  D:  Thu May 23 20:13:38 2013 ; T:  Thu May 23 23:24:33 2013  Job #:  [id number]

## 2011-09-01 ENCOUNTER — Ambulatory Visit (INDEPENDENT_AMBULATORY_CARE_PROVIDER_SITE_OTHER): Payer: BC Managed Care – PPO | Admitting: Gastroenterology

## 2011-09-01 DIAGNOSIS — B182 Chronic viral hepatitis C: Secondary | ICD-10-CM

## 2011-09-01 DIAGNOSIS — B181 Chronic viral hepatitis B without delta-agent: Secondary | ICD-10-CM

## 2011-09-01 DIAGNOSIS — K7460 Unspecified cirrhosis of liver: Secondary | ICD-10-CM

## 2011-09-01 NOTE — Patient Instructions (Signed)
Continue to go to [place] at [place] for a CBC with diff (CMP is not absolutely needed).  Results to be faxed to [phone]  Attn Dr Jacqualine Mau  The week of your next appt, do not go to [facility] for labs.  They will be done at [facility].

## 2011-09-07 ENCOUNTER — Other Ambulatory Visit: Payer: Self-pay | Admitting: Gastroenterology

## 2011-09-08 NOTE — Progress Notes (Signed)
NAMEMarland Kitchen  KEANON, BEVINS    MR#:  [id number]      DATE:  09/01/2011  DOB:  07/11/1955    cc: Consulting Physician: [name], MD, [facility], [address], [address], [place], [place] [address], [place] [phone]  Primary Care Physician: Same  Referring Physician: [name], FNP, c/o Louanna Raw, MD, [facility], [address], [place], [place] [address], Fax [phone]    [facility] [id number]  REASON FOR VISIT:  Followup of E antibody positive, genotype unknown, inactive hepatitis B and genotype 1a hepatitis C, cirrhosis on treatment week 45 of therapy.   HISTORY:  The patient returns today unaccompanied. It will be recalled that start date for combination of PEG interferon, and ribavirin was 10/22/2010. He takes his third last injection of interferon today. It will be recalled that I started him on erythropoietin at 60,000 units on 08/25/2011, for anemia in addition to reducing his ribavirin to 200 mg b.i.d. He received his erythropoietin last week and took his first injection last week. He finds it somewhat difficult to dose this because the dose is split to 40,00 and 20,000 units vial. Nevertheless, he took his first injection last week, due to take his second this week.   It will be recalled he had significant peripheral edema when last seen. I increased his diuretics back to 40 a day furosemide and 100 mg of spironolactone a day resulted in a 20 pound weight loss with improvement in his peripheral edema and no complaints of shortness breath or abdominal distention.   There are no symptoms of encephalopathy. There is no overt bleeding, and he has never bled from varices. He has undergone his ultrasound for hepatocellular cancer (HCC) screening today.   PAST MEDICAL HISTORY:  No interval change.   CURRENT MEDICATIONS:  1. Ribavirin 200 mg b.i.d.  2. Boceprevir 800 mg q. 8 hours.   3. Pegasys 180 mcg weekly.  4. Omeprazole 20 mg  daily.  5. Oxycodone 5 mg every 4 hours p.r.n. for arthralgias.   6. Spironolactone 100 mg daily.  7. Furosemide 40 mg daily.  8. Aspirin 81 mg daily.  9. Nasonex p.r.n.  10. Ondansetron 8 mg q. 8 hours p.r.n.  11. Escitalopram 5 mg daily.  12. Dulera inhaler. 13. 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  14. He reports that Bystolic has been discontinued by [facility].   ALLERGIES:  FLAGYL causes erythema.   HABITS:  Smoking, quit in 2000. Alcohol denies interval consumption.   REVIEW OF SYSTEMS:  All 10 systems reviewed today with the patient and they are negative other which is mentioned above. His CES-D was 25.   PHYSICAL EXAMINATION:  Constitutional: Pallor but otherwise no significant peripheral wasting. Vital signs: Height 66 inches, weight 168 pounds, blood pressure 103/71, pulse 74, temperature 97.9 Fahrenheit.   LABORATORY STUDIES:  From 08/29/2011, white count 3.2, ANC 2.0, hemoglobin 8.6, MCV 98.5, platelet count 112. Sodium was 134, potassium 4, chloride 97, bicarbonate 24, creatinine 1.0, BUN 9, total bilirubin was 1.6 with an albumin of 3.0, globulins 3.2, AST 44, ALT 22, ALP 106. This is compared to 08/24/2011, at which time his hemoglobin was 8.5 and his creatinine was 1.06.   Ultrasound from 08/19/2011, showed a cirrhotic liver. I reviewed this result with him today.   ASSESSMENT:  The patient is a 56 year old gentleman with history of genotype 1a hepatitis C, IL28B unknown, biopsy of 08/25/2010, showing macrosteatosis and cirrhosis. He is at week 45 of a  planned 48 weeks of therapy for genotype 1a hepatitis C, having started on 10/22/2010. His week 4, 8, 12, and 24 HCV RNA were all undetectable. He has required a dose reduction of ribavirin because of anemia. I have initiated erythropoietin because of that.   In terms of his hepatitis B his previous cirrhotic HBV serologies on 07/21/2011, showed that his HBV DNA was undetectable. He remained E antibody positive.  There is no indication for treatment of his HBV at this time. 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   In terms of his cirrhosis care, he has diuresed without decline in his renal function. Previous experience suggest that he may have some rise in his creatinine over the next few weeks and we may need to back down on the dosage again. With resolution of anemia, his edema may also improve. There is no encephalopathy to treat. There is no history of variceal bleeding. There is no thrombocytopenia to warrant screening endoscopy when he is off interferon. His ultrasound has been done and needs repeated by November 2013.  He is hepatitis A immune by testing and his B is suppressed.   In my discussion today with the patient, we reviewed his previous lab work, as well as the directions on how to do the erythropoietin injections.   PLAN:  1. Continue weekly CBCs through [place], results to be faxed to my office in [place].  2. He is hepatitis A immune.  3. Continue with erythropoietin at 60,000 units weekly.  4. Continue ribavirin dose reduced to 200 mg b.i.d.  5. Continue with furosemide at 40 and Spironolactone at 100 mg daily.  6. He will complete therapy in approximately 3 more injections. I intended to see him again in approximately 4 weeks' time, but because the clinic is closed, he will be seen in approximately 5 to 6 weeks' time.  7. We will need another ultrasound by November 2013. There will be no need for endoscopy to screen for varices provided he is off treatment and platelet count remains over 100,000.  8. By way of this note, I would ask that Ms. York continue the weekly CBCs with electrolytes, BUN and creatinine. I stated in the instructions to the patient, I did not think a CMP was needed, but now in retrospect, I do think it is necessary, so I would ask that Ms. York draw this  as part of weekly CBCs and fax results t [phone].               [name], MD   [phone]  D:  Thu Jun 06 20:31:38 2013 ; T:  Thu Jun 06 22:39:08 2013  Job #:  [id number]

## 2011-09-22 ENCOUNTER — Ambulatory Visit (INDEPENDENT_AMBULATORY_CARE_PROVIDER_SITE_OTHER): Payer: BC Managed Care – PPO | Admitting: Gastroenterology

## 2011-09-22 DIAGNOSIS — K7460 Unspecified cirrhosis of liver: Secondary | ICD-10-CM

## 2011-09-22 DIAGNOSIS — B182 Chronic viral hepatitis C: Secondary | ICD-10-CM

## 2011-09-22 DIAGNOSIS — B181 Chronic viral hepatitis B without delta-agent: Secondary | ICD-10-CM

## 2011-09-22 LAB — AFP TUMOR MARKER: AFP-Tumor Marker: 1.7 ng/mL (ref 0.0–8.0)

## 2011-09-22 MED ORDER — LACTULOSE 10 GM/15ML PO SOLN: 20.0000 g | Freq: Two times a day (BID) | ORAL | Status: AC

## 2011-09-22 NOTE — Patient Instructions (Addendum)
1) Stop the spironolactone and furosemide 2) Do not eat any added salt - keep the salt in your diet to less than 2 grams 3) Start lactulose take enough to give yourself two to three bowel movements per day 4) Next Monday or Tuesday go to [place] for the following labs : CMP, PT/INR Diagnoses are 070.54, 070.32, 571.5  Fax results to [phone] 5) I would like to see you back in about two weeks

## 2011-09-23 LAB — HEPATITIS B SURFACE ANTIGEN: Hepatitis B Surface Ag: POSITIVE — AB

## 2011-09-26 LAB — HEPATITIS B DNA, ULTRAQUANTITATIVE, PCR
Hepatitis B DNA (Calc): <116 copies/mL (ref <116)
Hepatitis B DNA: <20 IU/mL (ref <20)

## 2011-09-26 LAB — HEPATITIS B E ANTIBODY: Hepatitis Be Antibody: POSITIVE — AB

## 2011-10-13 ENCOUNTER — Ambulatory Visit (INDEPENDENT_AMBULATORY_CARE_PROVIDER_SITE_OTHER): Payer: BC Managed Care – PPO | Admitting: Gastroenterology

## 2011-10-13 DIAGNOSIS — B182 Chronic viral hepatitis C: Secondary | ICD-10-CM

## 2011-10-13 NOTE — Progress Notes (Signed)
NAMEMarland Kitchen  Francisco Floyd, KUMARI    MR#:  [id number]      DATE:  09/22/2011  DOB:  26-Feb-1956    cc: Consulting Physician: [name], MD, [facility], [address], [place], [place] [address], [place] [phone]  Primary Care Physician: Same  Referring Physician: [name], FNP, c/o Louanna Raw, MD, [facility], [address], [place], [place] [address], Fax [phone]    [facility] MEDICAL RECORD #[id number]  REASON FOR VISIT:  Follow up of E antibody positive, genotype unknown, inactive hepatitis B and genotype 1a hepatitis C, cirrhosis on treatment week 48 of therapy end of treatment visit.   HISTORY:  The patient returns today accompanied by his son with whom he lives. His start date for combination of peginterferon and ribavirin was 10/22/2010. Boceprevir was added to his peginterferon and ribavirin at week 4. He took his last injection of interferon 7 days ago on 09/15/2011. He is due to stop his ribavirin and boceprevir today at week 48. It will be recalled that he developed significant anemia such that I started him on erythropoietin. He took his last injection of this last Thursday on 09/15/2011, as well.   His son has noticed, for the last month, that the patient has had significant difficulty with confusion. His family has stopped him from driving. In addition, it will recalled he was on an increased dose of diuretics from May 2013 for peripheral edema. The patient reports that has been a trace peripheral edema, but no abdominal distention or shortness of breath. I note that he has lost another 23 pounds since last being seen. There has been no interval episodes of gastrointestinal bleeding, and he has never bled from varices. He has previously not been thrombocytopenia to warrant screening for varices. His son reports that he has some difficulty with poor appetite. There are no fevers or chills.   PAST MEDICAL HISTORY:    Otherwise no interval change.   CURRENT MEDICATIONS:  1. Boceprevir 800 mg q. 8 hours, which is finishing today.  2. Ribavirin 200 mg b.i.d., which is finishing today.   3. Omeprazole 20 mg daily.  4. Oxycodone 5 mg q.4 hours p.r.n. for arthralgias.   5. Spironolactone 100 mg daily. 6. Furosemide 40 mg daily.  7. Aspirin 81 mg daily.  8. Nasonex p.r.n.  9. Ondansetron 8 mg q. 8 hours p.r.n.  10. Escitalopram (generic for Lexapro) 5 mg daily.  11. Dulera inhaler. 12. Acyclovir 200 mg for herpes outbreaks p.r.n.   ALLERGIES:  FLAGYL causes erythema.   HABITS:  Smoking quit in 2000. Alcohol denies interval consumption.   REVIEW OF SYSTEMS:  All 10 systems reviewed today with the patient and they are negative, as confirmed by his son, other than which is mentioned above. His CES-D was 29.   PHYSICAL EXAMINATION:  Constitutional: Peripheral wasting, chronically unwell. Vital Signs: Height 66 inches, weight 165 pounds, blood pressure 82/58, pulse of 62, temperature 97.5 Fahrenheit. Ears, Nose, Mouth and Throat: Unremarkable oropharynx. The mucous membranes were somewhat dry. There is no thyromegaly or neck masses. Chest: Resonant to percussion. Clear to auscultation. Cardiovascular: Heart sounds normal S1, S2 without murmurs or rubs. There was a trace amount of peripheral edema around the ankles. Abdomen: Normal bowel sounds. No masses or tenderness. I could not appreciate liver edge. His spleen tip was not palpable. There were no hernias. There was no ascites. Lymphatics: No cervical or inguinal lymphadenopathy. Central Nervous System: No asterixis or focal  neurologic findings. Dermatologic: Slightly icteric. There was no palmar erythema. Eyes, slightly icteric. Pupils equal and reactive to light.   LABORATORIES:  From 09/19/2011; CBC: White count was 5.3, ANC 3.5, hemoglobin 11, MCV 100, platelet count 141. Sodium was 120, potassium was 4.7, chloride 88, bicarbonate 24, creatinine was 0.8.  His total bilirubin was 2.8 with an albumin of 3.5 and INR 1.28. His ALT was 20, AST was 41, ALP 172.   ASSESSMENT:  The patient is a 56 year old gentleman with history of genotype 1a hepatitis C, IL28B unknown with a biopsy on 08/25/2010, showing macrosteatosis and cirrhosis. He completed 48 weeks of a planned 48 weeks of therapy for genotype 1a hepatitis C, having started on 10/22/2010. His week 4, 8, 12, and 24 HCV RNA were all undetectable. He required dose reduction of ribavirin because of anemia. Eventually he required erythropoietin, as well. His hemoglobin has responded to this.   In terms of his hepatitis B, his hepatitis B serologies demonstrated, on 07/21/2011, that his HBV was suppressed, and he remained E antibody positive. He did not need treatment for hepatitis B at that time. The interferon he received for hepatitis C could only be beneficial for his hepatitis B.   In terms of his cirrhosis care, with his hyponatremia and his weight loss he may be over diuresed. The hyponatremia is also likely contributing to confusion. He may also have some new onset encephalopathy as a consequence of some decompensation of his liver function through the course of treatment of his hepatitis C. Certainly at this point we would have to discontinue his diuretics and deal with the edema as it may occur. I hope that with discontinuation of his treatment of his hepatitis C, his liver function may stabilize although his MELD currently is 13 and his MELD sodium is 23. Again, I think that with discontinuation of his hepatitis C therapy his liver function may stabilize, and we will avoid the need for transplantation. If his sodium is corrected, certainly with a MELD of 13, there is no rush to evaluate him. Previously there was no history of variceal bleeding. His platelet count suggests he does not need to be screened for varices. His ultrasound was completed on 08/15/2011, showing a cirrhotic liver and does not need  to be repeated until November 2013.  He is hepatitis A immune by testing and his B is suppressed as previously mentioned.   In my discussion today with the patient and his son, we discussed instructions to discontinue his ribavirin and boceprevir, as well as his erythropoietin now that he has completed 48 weeks of therapy. We discussed obtaining an end of treatment HCV RNA. I also discussed the possibility of his B flaring when his C has been suppressed. I have discussed obtaining another hepatitis B testing today even though it is slightly early.   I explained that it is likely that the patient has experienced some decompensation of his liver disease as a consequence of treatment, but with discontinuation, it would be my hope that things would stabilized. We discussed stopping his diuretics altogether. We discussed him remaining on a 2 grams sodium diet. We discussed repeating his labs through [place] next week. I also discussed starting lactulose for the possibly of encephalopathy.   PLAN:  1. All hepatitis C therapies to discontinue by today.  2. HCV RNA to be done today.  3. Repeat his hepatitis B serologies.  4. Stop his spironolactone and furosemide altogether. 5. Adhere to a 2 gram  sodium diet.  6. I have given him a prescription for lactulose 20 grams p.o. b.i.d. 1800 mL with 5 refills with instructions to titrate to 2-3 bowel movements per day.  7. I have told him to present next Monday or Tuesday to [place] [facility] for a CMP and PT/INR with results to be faxed to my [place] office at [phone].  8. I would like to see him back in 2 weeks' time.  9. I have told the son that if anything was to occur in the interim, he is to bring his father to [facility] hospital for admission. We discussed possibly even admitting him today because of the confusion and hypernatremia. I am comfortable sending him home and his son says he was very comfortable managing the medication changes at home, as  well.  10. The patient knows not to drive as his family has already instructed him not to and I agreed with that.               [name], MD   [phone]  D:  Thu Jun 27 13:38:44 2013 ; T:  Thu Jun 27 19:37:20 2013  Job #:  [id number]

## 2011-10-20 NOTE — Progress Notes (Signed)
NAMEMarland Floyd  RYOTA, TREECE    MR#:  [id number]      DATE:  10/13/2011  DOB:  Feb 09, 1956         REFERRING PHYSICIAN:  [name], FNP, c/o Louanna Raw, MD, [facility], [address] [place], [place] [address], fax [phone].    primary care physician:  [name], FNP, c/o Louanna Raw, MD, [facility], [address] [place], [place] [address], fax [phone].    consulting physician:  [name], MD, [facility], [address], [address], [place], [place] [address], fax [phone].    [facility] medical record number is [id number].  REASON FOR VISIT: Followup of E-antibody positive, genotype unknown, inactive hepatitis B, and genotype 1A hepatitis C, cirrhosis post-completion of HCV therapy.   HISTORY: The patient returns today unaccompanied. He looks and feels remarkably better than when I last saw him. He believes he stopped his pegylated interferon around 09/22/2011 and took his boceprevir and ribavirin for another week. He also stopped his erythropoietin at the time.   His peripheral edema has improved so much that he is now taking his diuretics on an intermittent basis. He may take this 3-4 days every 2 weeks. His encephalopathy is markedly better such that he is having no confusion whatsoever. There are no interval episodes of gastrointestinal bleeding. There are no fevers or chills.   PAST MEDICAL HISTORY: No interval change.   CURRENT MEDICATIONS: Oxycodone 5 mg every 4 hours p.r.n. for arthralgias. He reports he is not taking any other medications at this time. As mentioned above, he will take his spironolactone and furosemide on a p.r.n. basis.   ALLERGIES: FLAGYL causes erythema.   HABITS: Smoking quit in 2000. Alcohol:  Denies interval consumption.   REVIEW OF SYSTEMS: All 10 systems reviewed today with the patient and they are negative other than which is mentioned  above. A CES-D was 18.   PHYSICAL EXAMINATION: Constitutional:  Well appearing with improved color and alertness. However, there is peripheral wasting. Vital signs: Height 66 inches, weight 166 pounds, blood pressure 117/88, pulse of 96, temperature 96.4 Fahrenheit.    LABORATORIES: Laboratory work from [facility] on 10/10/2011, CBC: White count 2.7, hemoglobin 11.4, MCV 97.1, platelet count 112,000. Electrolytes were normal with a creatinine of 0.71, total bilirubin 0.9, INR 1.39, albumin 2.7, AST 71, ALT 33, ALP 247.   ASSESSMENT: The patient is a 56 year old gentleman with history of genotype 1A hepatitis C, IL-28B unknown, biopsy on 08/25/2010 showing macrosteatosis and cirrhosis.   In terms of his genotype 1A hepatitis C, he completed 48 weeks of pegylated interferon, ribavirin, and boceprevir having started on 10/22/2010.  Although his week 4, 8, 12, and 24 HCV RNA were undetectable, I continued to 48 weeks of therapy because of the cirrhosis. He also required a dose reduction in his ribavirin because of anemia with the introduction of erythropoietin because of the severity of the anemia.   In terms of his hepatitis B, his previous serologies on 07/21/2011 showed his HBV DNA was undetectable though he remained hepatitis E antibody positive. Repeat testing on 09/22/2011 showed that he continued to be hepatitis B surface antigen positive as expected and remained E antibody positive with a detectable but not quantifiable HBV DNA. Parenthetically his HCV RNA at the same time was undetectable, indicating an end of treatment response.   In terms of his cirrhosis care, his edema is significantly improved and his anemia is improved now that  he is off treatment with pegylated interferon, ribavirin and boceprevir. There is no encephalopathy to treat. There is no history of variceal bleeding. There is no significant thrombocytopenia to warrant screening by endoscopy. An ultrasound was done on  08/19/2011 and needs repeated by 01/2012.  His hepatitis A is immune by history and his hepatitis B is suppressed.   In my discussion today with the patient we reviewed his previous lab work and his course to date.   PLAN:  1. Hepatitis A immune.  2. He is to remain off erythropoietin in addition to have completed his HCV RNA therapy.  3. He will remain off diuretics.  4. Will need another ultrasound by 01/2012.  5. He will not need an endoscopy to screen for varices provided his platelet count off treatment is over 100,000.  6. He will be seen again in mid 11/2011 at which time we will check his HCV RNA for a month 3 post-completion of therapy HCV RNA.               [name], MD   [phone]  D:  Thu Jul 18 20:09:37 2013 ; T:  Fri Jul 19 09:45:57 2013  Job #:  [id number]

## 2011-12-15 ENCOUNTER — Ambulatory Visit (INDEPENDENT_AMBULATORY_CARE_PROVIDER_SITE_OTHER): Payer: BC Managed Care – PPO | Admitting: Gastroenterology

## 2011-12-15 DIAGNOSIS — B182 Chronic viral hepatitis C: Secondary | ICD-10-CM

## 2011-12-15 DIAGNOSIS — B181 Chronic viral hepatitis B without delta-agent: Secondary | ICD-10-CM

## 2011-12-15 DIAGNOSIS — K7460 Unspecified cirrhosis of liver: Secondary | ICD-10-CM

## 2011-12-15 LAB — PROTIME-INR
INR: 1.29 (ref <1.50)
Prothrombin Time: 15.9 seconds — ABNORMAL HIGH (ref 11.6–15.2)

## 2011-12-15 NOTE — Patient Instructions (Signed)
1)  In October 2013, please have Francisco Floyd order an ultrasound of the liver at [facility] indication being Hepatitis B and C cirrhosis.  Please include imaging of the spleen and kidneys.  Results of this and all future correspondence should be faxed to [phone] only.  The [place] office is closed as of 12/16/11. 2) I will see you in about 3 - 6 months

## 2011-12-16 LAB — CBC WITH DIFFERENTIAL/PLATELET
Basophils Absolute: 0 10*3/uL (ref 0.0–0.1)
Basophils Relative: 0 % (ref 0–1)
Eosinophils Absolute: 0.2 10*3/uL (ref 0.0–0.7)
MCH: 30.9 pg (ref 26.0–34.0)
MCHC: 33.8 g/dL (ref 30.0–36.0)
Neutro Abs: 4.7 10*3/uL (ref 1.7–7.7)
Neutrophils Relative %: 66 % (ref 43–77)
Platelets: 183 10*3/uL (ref 150–400)
RDW: 14.4 % (ref 11.5–15.5)

## 2011-12-16 LAB — COMPLETE METABOLIC PANEL WITH GFR
Albumin: 3.4 g/dL — ABNORMAL LOW (ref 3.5–5.2)
Alkaline Phosphatase: 84 U/L (ref 39–117)
BUN: 8 mg/dL (ref 6–23)
CO2: 26 mEq/L (ref 19–32)
GFR, Est African American: >89 mL/min
GFR, Est Non African American: >89 mL/min
Glucose, Bld: 66 mg/dL — ABNORMAL LOW (ref 70–99)
Potassium: 3.9 mEq/L (ref 3.5–5.3)
Sodium: 134 mEq/L — ABNORMAL LOW (ref 135–145)
Total Bilirubin: 1.1 mg/dL (ref 0.3–1.2)
Total Protein: 6.1 g/dL (ref 6.0–8.3)

## 2011-12-16 LAB — HEPATITIS B SURFACE ANTIGEN: Hepatitis B Surface Ag: POSITIVE — AB

## 2011-12-19 LAB — HEPATITIS C RNA QUANTITATIVE: HCV Quantitative: NOT DETECTED IU/mL (ref <43)

## 2011-12-20 LAB — HEPATITIS B E ANTIBODY: Hepatitis Be Antibody: POSITIVE — AB

## 2011-12-21 LAB — HEPATITIS B DNA, ULTRAQUANTITATIVE, PCR
Hepatitis B DNA (Calc): <116 copies/mL (ref <116)
Hepatitis B DNA: <20 IU/mL (ref <20)

## 2011-12-26 ENCOUNTER — Encounter: Payer: Self-pay | Admitting: Gastroenterology

## 2011-12-26 NOTE — Progress Notes (Signed)
NAMEMarland Kitchen  Francisco Floyd, Francisco Floyd    MR#:  [id number]      DATE:  12/15/2011  DOB:  [birth year]         REFERRING PHYSICIAN:   [name], FNP, Careb Louanna Raw, MD, [facility], [address] [place], [place] [address], fax [phone].  PRIMARY CARE PHYSICIAN: [name], FNP, Careb Louanna Raw, MD, [facility], [address], [place], [place] [address], fax [phone].  CONSULTING PHYSICIAN: [name], MD, [facility], [address], [address], [place], [place]  [address], fax [phone].  reason for visit:   Followup of E antibody positive, genotype unknown, inactive hepatitis B and genotype 1A hepatitis C induced cirrhosis, approximately 3 months post-completion of HCV therapy.   HISTORY:  The patient returns today unaccompanied. He continues to look much better and continues to improve since he has discontinued hepatitis C therapy. He has gained some weight. His muscle mass is improved somewhat. He has intermittent peripheral edema for which he takes diuretics. Perhaps he may require diuretics for less than week in every 4 and does not exceed the doses he has been prescribed. He also elevates his legs at night. He denies any shortness of breath or abdominal distention. There are no symptoms of encephalopathy. He has never had an episode of gastrointestinal bleeding. He has no symptoms to suggest cryoglobulin mediated disease.   PAST MEDICAL HISTORY:  No interval change.   CURRENT MEDICATIONS:  Oxycodone 5 mg every 4 hours p.r.n. for arthralgias. He also remains on Dulera inhaler p.r.n., aspirin daily, Lactulose p.r.n. for constipation, Neurontin at bedtime p.r.n. He did not bring a list of medications nor the doses with him today.   ALLERGIES:  FLAGYL causes erythema.   HABITS:  Smoking quit in 2000. Alcohol denies interval consumption.   SOCIAL HISTORY:  The patient is currently on long-term  disability through his work and will not be returning to work.   REVIEW OF SYSTEMS:  All 10 systems reviewed today with the patient and they are negative other which is mentioned above. His CES-D was 17.   PHYSICAL EXAMINATION:  Constitutional: Well appearing without significant peripheral wasting. He was anicteric. The peripheral wasting looked somewhat better. Vital signs: Height 66 inches, weight 184 pounds up from 166 previously, blood pressure 140/86, pulse 91, temperature 98.0 Fahrenheit. Ears, Nose, Mouth and Throat:  Unremarkable oropharynx.  No thyromegaly or neck masses.  Chest:  Resonant to percussion.  Clear to auscultation.  Cardiovascular:  Heart sounds normal S1, S2 without murmurs or rubs.  There is no peripheral edema.  Abdomen: Normal bowel sounds.  No masses or tenderness.  The right lobe of the liver is palpable on deep inspiration, but I could not appreciate a spleen tip.  I could not appreciate any hernias.  Lymphatics:  No cervical or inguinal lymphadenopathy.  Central Nervous System:  No asterixis or focal neurologic findings.  Dermatologic:  Anicteric without palmar erythema or spider angiomata.  Eyes:  Anicteric sclerae.  Pupils are equal and reactive to light.  LABORATORIES:  Laboratory work is pending.   ASSESSMENT:  The patient is a 56 year old gentleman with history of genotype 1A hepatitis C, IL28B unknown, biopsy of 08/25/2010, showing macrosteatosis and cirrhosis. In addition, he has inactive hepatitis B, genotype unknown, E antibody positive.   In terms of his genotype 1A hepatitis C, he completed 48 weeks of pegylated interferon, ribavirin, and boceprevir having started on 10/22/2010. Although his week 4, 8, 12, and  24 HCV RNA were undetectable, I continued him on 48 weeks of therapy because he had cirrhosis. He required dose reductions in ribavirin because of anemia with the introduction of erythropoietin eventually because of lack of response to the ribavirin dose  reduction. His 22-month post-completion HCV RNA is due today.   In terms of hepatitis B, he has previously had an undetectable HBV RNA and remained E antibody positive. It is time to repeat his HBV testing now that he is off interferon based therapies.   In terms of his cirrhosis care, his edema is significantly improved while off his hepatitis C therapy. There is no encephalopathy to treat. There is no history of variceal bleeding. Previously without thrombocytopenia he does not need screening for varices. He is due for another ultrasound in 01/2012 but asks if this could be done in 12/2011 due to his Cobra expiring at the end of October. His hepatitis A is immune by history and his hepatitis B is suppressed as previously mentioned.   In my discussion today with the patient we discussed his previous lab results and their significance. We discussed getting an HCV RNA today as well as checking his hepatitis B serologies. We discussed the importance of continued followup even if his HCV RNA is cleared because of his cirrhosis and need to monitor his hepatitis B. I have told him he should not exceed the dose of diuretics as prescribed and avoid using them on a p.r.n. basis.   PLAN:  1. Hepatitis A immune.  2. No indication for variceal screening provided his platelet count remains over 100,000.  3. Book for ultrasound in 12/2011 at his request.  I have given him written instructions to take to his primary, [name], FNP, to order through [facility] as I do not have privileges there.  4. CBC, INR, CMP and AFP today.  5. Check HBV serologies including HBV DNA today.  6. Check HCV RNA today.  7. The patient should return in approximately 3-6 months' time at [facility].               [name], MD  ADDENDUM HCV RNA undetectable 3 months post treatment.    HBV DNA detectable but not quantifiable, Hep B s Ag positive.  Hep B e Ab positive.  ALT 13   [id number]  D:  Thu Sep 19 17:49:09  2013 ; T:  Fri Sep 20 09:39:51 2013  Job #:  [id number]

## 2014-05-20 ENCOUNTER — Other Ambulatory Visit: Payer: Self-pay | Admitting: Pain Medicine

## 2014-05-26 ENCOUNTER — Other Ambulatory Visit: Payer: Self-pay | Admitting: Pain Medicine

## 2014-05-26 DIAGNOSIS — M545 Low back pain, unspecified: Secondary | ICD-10-CM

## 2014-06-09 ENCOUNTER — Other Ambulatory Visit: Payer: Self-pay | Admitting: Pain Medicine

## 2014-06-09 DIAGNOSIS — Z77018 Contact with and (suspected) exposure to other hazardous metals: Secondary | ICD-10-CM

## 2014-06-27 ENCOUNTER — Ambulatory Visit
Admission: RE | Admit: 2014-06-27 | Discharge: 2014-06-27 | Disposition: A | Discharge status: Home / Self Care | Payer: Medicare HMO | Source: Ambulatory Visit | Attending: Pain Medicine | Admitting: Pain Medicine

## 2014-06-27 ENCOUNTER — Encounter (INDEPENDENT_AMBULATORY_CARE_PROVIDER_SITE_OTHER): Payer: Self-pay

## 2014-06-27 DIAGNOSIS — M545 Low back pain, unspecified: Secondary | ICD-10-CM

## 2014-06-27 DIAGNOSIS — Z77018 Contact with and (suspected) exposure to other hazardous metals: Secondary | ICD-10-CM

## 2014-10-10 ENCOUNTER — Other Ambulatory Visit: Payer: Self-pay

## 2018-04-23 ENCOUNTER — Other Ambulatory Visit: Payer: Self-pay

## 2018-04-23 ENCOUNTER — Inpatient Hospital Stay (HOSPITAL_COMMUNITY)
Admission: EM | Admit: 2018-04-23 | Discharge: 2018-05-04 | DRG: 200 | Disposition: A | Discharge status: Home Health | Payer: Medicare HMO | Attending: Surgery | Admitting: Surgery

## 2018-04-23 ENCOUNTER — Inpatient Hospital Stay (HOSPITAL_COMMUNITY): Payer: Medicare HMO

## 2018-04-23 ENCOUNTER — Emergency Department (HOSPITAL_COMMUNITY): Payer: Medicare HMO

## 2018-04-23 ENCOUNTER — Encounter (HOSPITAL_COMMUNITY): Payer: Self-pay

## 2018-04-23 DIAGNOSIS — Z79899 Other long term (current) drug therapy: Secondary | ICD-10-CM

## 2018-04-23 DIAGNOSIS — Z87891 Personal history of nicotine dependence: Secondary | ICD-10-CM

## 2018-04-23 DIAGNOSIS — W14XXXA Fall from tree, initial encounter: Secondary | ICD-10-CM

## 2018-04-23 DIAGNOSIS — K5900 Constipation, unspecified: Secondary | ICD-10-CM | POA: Diagnosis present

## 2018-04-23 DIAGNOSIS — S271XXA Traumatic hemothorax, initial encounter: Principal | ICD-10-CM | POA: Diagnosis present

## 2018-04-23 DIAGNOSIS — S2241XA Multiple fractures of ribs, right side, initial encounter for closed fracture: Secondary | ICD-10-CM | POA: Diagnosis present

## 2018-04-23 DIAGNOSIS — Y9289 Other specified places as the place of occurrence of the external cause: Secondary | ICD-10-CM | POA: Diagnosis not present

## 2018-04-23 DIAGNOSIS — Z9689 Presence of other specified functional implants: Secondary | ICD-10-CM

## 2018-04-23 DIAGNOSIS — J939 Pneumothorax, unspecified: Secondary | ICD-10-CM

## 2018-04-23 DIAGNOSIS — Y9389 Activity, other specified: Secondary | ICD-10-CM | POA: Diagnosis not present

## 2018-04-23 DIAGNOSIS — K7460 Unspecified cirrhosis of liver: Secondary | ICD-10-CM | POA: Diagnosis present

## 2018-04-23 DIAGNOSIS — R0781 Pleurodynia: Secondary | ICD-10-CM | POA: Diagnosis present

## 2018-04-23 DIAGNOSIS — Z23 Encounter for immunization: Secondary | ICD-10-CM | POA: Diagnosis present

## 2018-04-23 DIAGNOSIS — J942 Hemothorax: Secondary | ICD-10-CM

## 2018-04-23 DIAGNOSIS — S27321A Contusion of lung, unilateral, initial encounter: Secondary | ICD-10-CM | POA: Diagnosis present

## 2018-04-23 DIAGNOSIS — B029 Zoster without complications: Secondary | ICD-10-CM | POA: Diagnosis present

## 2018-04-23 DIAGNOSIS — Z938 Other artificial opening status: Secondary | ICD-10-CM

## 2018-04-23 DIAGNOSIS — W19XXXA Unspecified fall, initial encounter: Secondary | ICD-10-CM

## 2018-04-23 DIAGNOSIS — B1920 Unspecified viral hepatitis C without hepatic coma: Secondary | ICD-10-CM | POA: Diagnosis present

## 2018-04-23 HISTORY — DX: Multiple fractures of ribs, unspecified side, initial encounter for closed fracture: S22.49XA

## 2018-04-23 HISTORY — DX: Fracture of one rib, unspecified side, initial encounter for closed fracture: S22.39XA

## 2018-04-23 HISTORY — DX: Unspecified viral hepatitis C without hepatic coma: B19.20

## 2018-04-23 HISTORY — DX: Unspecified cirrhosis of liver: K74.60

## 2018-04-23 LAB — COMPREHENSIVE METABOLIC PANEL
ALT: 49 U/L — ABNORMAL HIGH (ref 0–44)
AST: 96 U/L — ABNORMAL HIGH (ref 15–41)
Albumin: 3.4 g/dL — ABNORMAL LOW (ref 3.5–5.0)
Alkaline Phosphatase: 76 U/L (ref 38–126)
Anion gap: 11 (ref 5–15)
BUN: 9 mg/dL (ref 8–23)
CHLORIDE: 106 mmol/L (ref 98–111)
CO2: 20 mmol/L — ABNORMAL LOW (ref 22–32)
Calcium: 8.9 mg/dL (ref 8.9–10.3)
Creatinine, Ser: 0.87 mg/dL (ref 0.61–1.24)
GFR calc Af Amer: >60 mL/min (ref >60)
GFR calc non Af Amer: >60 mL/min (ref >60)
Glucose, Bld: 143 mg/dL — ABNORMAL HIGH (ref 70–99)
POTASSIUM: 3.8 mmol/L (ref 3.5–5.1)
Sodium: 137 mmol/L (ref 135–145)
Total Bilirubin: 1.1 mg/dL (ref 0.3–1.2)
Total Protein: 6.5 g/dL (ref 6.5–8.1)

## 2018-04-23 LAB — URINALYSIS, ROUTINE W REFLEX MICROSCOPIC
Bilirubin Urine: NEGATIVE
Glucose, UA: NEGATIVE mg/dL
Ketones, ur: NEGATIVE mg/dL
Leukocytes, UA: NEGATIVE
Nitrite: NEGATIVE
Protein, ur: 30 mg/dL — AB
Specific Gravity, Urine: 1.029 (ref 1.005–1.030)
pH: 6 (ref 5.0–8.0)

## 2018-04-23 LAB — SAMPLE TO BLOOD BANK

## 2018-04-23 LAB — CBC
HEMATOCRIT: 47.6 % (ref 39.0–52.0)
Hemoglobin: 16.4 g/dL (ref 13.0–17.0)
MCH: 31.7 pg (ref 26.0–34.0)
MCHC: 34.5 g/dL (ref 30.0–36.0)
MCV: 91.9 fL (ref 80.0–100.0)
Platelets: 222 10*3/uL (ref 150–400)
RBC: 5.18 MIL/uL (ref 4.22–5.81)
RDW: 13.2 % (ref 11.5–15.5)
WBC: 28.4 10*3/uL — ABNORMAL HIGH (ref 4.0–10.5)
nRBC: 0 % (ref 0.0–0.2)

## 2018-04-23 LAB — PROTIME-INR
INR: 1.3
Prothrombin Time: 16 seconds — ABNORMAL HIGH (ref 11.4–15.2)

## 2018-04-23 LAB — MRSA PCR SCREENING: MRSA by PCR: NEGATIVE

## 2018-04-23 LAB — LACTIC ACID, PLASMA: Lactic Acid, Venous: 2.8 mmol/L (ref 0.5–1.9)

## 2018-04-23 LAB — ETHANOL: Alcohol, Ethyl (B): <10 mg/dL (ref <10)

## 2018-04-23 MED ORDER — HYDROMORPHONE HCL 1 MG/ML IJ SOLN
1.0000 mg | Freq: Once | INTRAMUSCULAR | Status: AC
  Administered 2018-04-23: 1 mg via INTRAVENOUS
  Filled 2018-04-23: qty 1

## 2018-04-23 MED ORDER — OXYCODONE HCL 5 MG PO TABS
10.0000 mg | ORAL_TABLET | ORAL | Status: DC | PRN
  Administered 2018-04-23 – 2018-04-24 (×3): 10 mg via ORAL
  Filled 2018-04-23 (×3): qty 2

## 2018-04-23 MED ORDER — IOHEXOL 300 MG/ML  SOLN
100.0000 mL | Freq: Once | INTRAMUSCULAR | Status: AC | PRN
  Administered 2018-04-23: 100 mL via INTRAVENOUS

## 2018-04-23 MED ORDER — OXYCODONE HCL 5 MG PO TABS
5.0000 mg | ORAL_TABLET | ORAL | Status: DC | PRN
  Administered 2018-04-24 (×2): 5 mg via ORAL
  Filled 2018-04-23 (×2): qty 1

## 2018-04-23 MED ORDER — HYDROMORPHONE HCL 1 MG/ML IJ SOLN
0.5000 mg | INTRAMUSCULAR | Status: DC | PRN
  Administered 2018-04-23 – 2018-04-24 (×8): 1 mg via INTRAVENOUS
  Filled 2018-04-23 (×8): qty 1

## 2018-04-23 MED ORDER — FENTANYL CITRATE (PF) 100 MCG/2ML IJ SOLN
100.0000 ug | Freq: Once | INTRAMUSCULAR | Status: AC
  Administered 2018-04-23: 100 ug via INTRAVENOUS
  Filled 2018-04-23: qty 2

## 2018-04-23 MED ORDER — HYDROMORPHONE HCL 1 MG/ML IJ SOLN
1.0000 mg | INTRAMUSCULAR | Status: AC
  Administered 2018-04-23: 1 mg via INTRAVENOUS
  Filled 2018-04-23: qty 1

## 2018-04-23 MED ORDER — TETANUS-DIPHTH-ACELL PERTUSSIS 5-2.5-18.5 LF-MCG/0.5 IM SUSP
0.5000 mL | Freq: Once | INTRAMUSCULAR | Status: AC
  Administered 2018-04-23: 0.5 mL via INTRAMUSCULAR
  Filled 2018-04-23: qty 0.5

## 2018-04-23 MED ORDER — ONDANSETRON 4 MG PO TBDP: 4.0000 mg | ORAL_TABLET | Freq: Four times a day (QID) | ORAL | Status: DC | PRN

## 2018-04-23 MED ORDER — DOCUSATE SODIUM 100 MG PO CAPS
100.0000 mg | ORAL_CAPSULE | Freq: Two times a day (BID) | ORAL | Status: DC
  Administered 2018-04-24 – 2018-05-04 (×21): 100 mg via ORAL
  Filled 2018-04-23 (×23): qty 1

## 2018-04-23 MED ORDER — TRAMADOL HCL 50 MG PO TABS
50.0000 mg | ORAL_TABLET | Freq: Four times a day (QID) | ORAL | Status: DC | PRN
  Administered 2018-04-24 – 2018-05-03 (×14): 50 mg via ORAL
  Filled 2018-04-23 (×14): qty 1

## 2018-04-23 MED ORDER — ZOLPIDEM TARTRATE 5 MG PO TABS
5.0000 mg | ORAL_TABLET | Freq: Every evening | ORAL | Status: DC | PRN
  Administered 2018-04-23: 5 mg via ORAL
  Filled 2018-04-23: qty 1

## 2018-04-23 MED ORDER — SODIUM CHLORIDE 0.9 % IV BOLUS
500.0000 mL | Freq: Once | INTRAVENOUS | Status: AC
  Administered 2018-04-23: 500 mL via INTRAVENOUS

## 2018-04-23 MED ORDER — SODIUM CHLORIDE 0.9 % IV SOLN
INTRAVENOUS | Status: DC
  Administered 2018-04-23: 18:00:00 via INTRAVENOUS

## 2018-04-23 MED ORDER — POLYETHYLENE GLYCOL 3350 17 G PO PACK
17.0000 g | PACK | Freq: Every day | ORAL | Status: DC
  Administered 2018-04-25 – 2018-05-03 (×9): 17 g via ORAL
  Filled 2018-04-23 (×10): qty 1

## 2018-04-23 MED ORDER — METHOCARBAMOL 500 MG PO TABS
750.0000 mg | ORAL_TABLET | Freq: Three times a day (TID) | ORAL | Status: DC | PRN
  Administered 2018-04-23: 750 mg via ORAL
  Filled 2018-04-23 (×2): qty 2

## 2018-04-23 MED ORDER — HYDRALAZINE HCL 20 MG/ML IJ SOLN: 10.0000 mg | INTRAMUSCULAR | Status: DC | PRN

## 2018-04-23 MED ORDER — METOPROLOL TARTRATE 5 MG/5ML IV SOLN: 5.0000 mg | Freq: Four times a day (QID) | INTRAVENOUS | Status: DC | PRN

## 2018-04-23 MED ORDER — ONDANSETRON HCL 4 MG/2ML IJ SOLN
4.0000 mg | Freq: Four times a day (QID) | INTRAMUSCULAR | Status: DC | PRN
  Filled 2018-04-23: qty 2

## 2018-04-23 NOTE — ED Notes (Signed)
This RN transported patient to CT at [id number]

## 2018-04-23 NOTE — ED Notes (Signed)
ED Provider at bedside. 

## 2018-04-23 NOTE — Progress Notes (Signed)
Orthopedic Tech Progress Note Patient Details:  Francisco Floyd October 24, 1955 [id number] Level 2 trauma  Patient ID: [name], male   DOB: 08/05/1955, 63 y.o.   MRN: [id number]   Donald Pore 04/23/2018, 2:23 PM

## 2018-04-23 NOTE — ED Provider Notes (Signed)
[facility]   CSN: [id number] Arrival date & time: 04/23/18  1347     History   Chief Complaint Chief Complaint  Patient presents with  . Fall    pt fell off ladder ~ 8-12 ft    HPI Francisco MallowClifton Floyd is a 63 y.o. male.  Patient with cirrhosis liver and hepatitis C presents for evaluation after a fall off approximately 12 feet from a ladder.  Patient denies syncope or symptoms prior to the event.  Patient recalls most of the details.  Patient has significant pain in the right flank area with any breathing or palpation.  Patient having significant pain during discussion make it more difficult to obtain details.  Patient denies blood thinners.     Past Medical History:  Diagnosis Date  . Cirrhosis of liver (HCC)   . Hepatitis C     There are no active problems to display for this patient.   History reviewed. No pertinent surgical history.      Home Medications    Prior to Admission medications   Medication Sig Start Date End Date Taking? Authorizing Provider  OVER THE COUNTER MEDICATION Take 1 capsule by mouth daily. Kratom supplement for pain relief   Yes [provider]  oxyCODONE (ROXICODONE) 15 MG immediate release tablet Take 15 mg by mouth every 4 (four) hours as needed for pain.   Yes [provider]  furosemide (LASIX) 40 MG tablet Take 1 tablet (40 mg total) by mouth daily. Patient not taking: Reported on 04/23/2018 08/18/11 08/17/12  Brooke DareZacks, Steven, MD  spironolactone (ALDACTONE) 100 MG tablet Take 1 tablet (100 mg total) by mouth daily. Patient not taking: Reported on 04/23/2018 08/18/11 08/17/12  Brooke DareZacks, Steven, MD    Family History No family history on file.  Social History Social History   Tobacco Use  . Smoking status: Not on file  Substance Use Topics  . Alcohol use: Not on file  . Drug use: Not on file     Allergies   Flagyl [metronidazole hcl]   Review of Systems Review of  Systems  Unable to perform ROS: Acuity of condition     Physical Exam Updated Vital Signs BP (!) 144/90   Pulse 99   Temp (!) 97.5 F (36.4 C)   Resp (!) 27   Ht 5\' 7"  (1.702 m)   Wt 100.7 kg   SpO2 96%   BMI 34.77 kg/m   Physical Exam Vitals signs and nursing note reviewed.  Constitutional:      Appearance: He is well-developed.  HENT:     Head: Normocephalic and atraumatic.  Eyes:     General:        Right eye: No discharge.        Left eye: No discharge.     Conjunctiva/sclera: Conjunctivae normal.  Neck:     Musculoskeletal: No muscular tenderness.     Trachea: No tracheal deviation.  Cardiovascular:     Rate and Rhythm: Normal rate and regular rhythm.  Pulmonary:     Effort: Pulmonary effort is normal.     Breath sounds: Normal breath sounds.  Abdominal:     General: There is no distension.     Palpations: Abdomen is soft.     Tenderness: There is abdominal tenderness. There is no guarding.  Musculoskeletal:        General: Swelling, tenderness and signs of injury present.     Comments: Patient has significant tenderness right  flank along the ribs and right mid flank in the abdomen.  Pain with breathing.  C-collar in place.  No significant midline spinal tenderness.  Patient has no tenderness with flexion of knees or hips bilateral.  No joint effusion to lower extremities.  Superficial abrasion to left anterior tibia without tenderness.  No significant pain to movement of upper extremities bilateral.  Skin:    General: Skin is warm.     Findings: No rash.  Neurological:     Mental Status: He is alert and oriented to person, place, and time.      ED Treatments / Results  Labs (all labs ordered are listed, but only abnormal results are displayed) Labs Reviewed  CBC - Abnormal; Notable for the following components:      Result Value   WBC 28.4 (*)    All other components within normal limits  PROTIME-INR - Abnormal; Notable for the following components:     Prothrombin Time 16.0 (*)    All other components within normal limits  ETHANOL  CDS SEROLOGY  COMPREHENSIVE METABOLIC PANEL  URINALYSIS, ROUTINE W REFLEX MICROSCOPIC  LACTIC ACID, PLASMA  SAMPLE TO BLOOD BANK    EKG EKG Interpretation  Date/Time:  Monday April 23 2018 13:52:03 EST Ventricular Rate:  94 PR Interval:    QRS Duration: 135 QT Interval:  387 QTC Calculation: 484 R Axis:   -56 Text Interpretation:  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 Multiplanar CT image reconstructions of the cervical spine were also generated. COMPARISON:  None. FINDINGS: CT HEAD FINDINGS Brain: No acute intracranial hemorrhage. No midline shift or mass effect. Gray-white differentiation maintained. Unremarkable appearance of the ventricular system. Vascular: Vascular Skull: No acute fracture.  No aggressive bone lesion identified. Sinuses/Orbits: Unremarkable appearance of the orbits. Mastoid air cells clear. No middle ear effusion. No significant sinus disease. Other: None CT CERVICAL SPINE FINDINGS Alignment: Craniocervical junction aligned. Anatomic alignment of the cervical elements. No subluxation. Skull base and vertebrae: No acute fracture at the skullbase. Vertebral body heights relatively maintained. No acute fracture identified. Soft tissues and spinal canal: Subcutaneous/myofacial gas of the right right neck and right chest extending from the inferior aspects of the exam along the musculature nearly to the skull base. No canal hematoma. No adenopathy. No radiopaque metallic foreign body Disc  levels:  Disc space narrowing throughout the cervical spine. C2-C3: Mild disc disease without significant canal narrowing or foraminal narrowing. C3-C4: Mild disc disease and uncovertebral joint disease without significant foraminal narrowing or canal narrowing. C4-C5: No significant canal narrowing or foraminal narrowing with mild disc disease. C5-C6: Disc osteophyte complex with posterior disc bulge/protrusion, eccentric to the left appearing to contact the ventral thecal sac. Associated uncovertebral joint disease bilaterally with facet disease contributing to bilateral foraminal narrowing. C6-C7: Posterior disc osteophyte complex with uncovertebral joint disease contributing to mild bilateral foraminal narrowing. No significant bony canal narrowing. C7-T1: No significant canal narrowing or foraminal narrowing. Upper chest: Tiny right apical pneumothorax. Fractures of the posterior right second and third ribs at the articulation. Displaced anterior right first rib fracture. Myofacial/subcutaneous gas at the right chest and right  neck. Mixed interstitial and airspace disease at the right lung apex. No left pneumothorax. Other: Mild carotid calcifications. IMPRESSION: Head CT: Negative head CT for acute intracranial abnormality. Cervical CT: No acute fracture or malalignment of the cervical spine. Multilevel degenerative changes, as above. Worst degree of disc disease present at C5-C6 where there is posterior disc bulge/protrusion, eccentric to the left that may contact the ventral thecal sac. **An incidental finding of potential clinical significance has been found. Small right pneumothorax, better characterized on contemporaneous chest CT with associated right first, second, third rib fractures and subcutaneous/myofacial emphysema.** These results were called by telephone at the time of interpretation on 04/23/2018 at 3:33 pm to Dr. Blane OharaJOSHUA Raza Bayless. Electronically Signed   By: Gilmer MorJaime  Wagner D.O.   On: 04/23/2018  15:34   Ct Chest W Contrast  Result Date: 04/23/2018 CLINICAL DATA:  Chest and abdominal pain after fall off ladder. EXAM: CT CHEST, ABDOMEN, AND PELVIS WITH CONTRAST TECHNIQUE: Multidetector CT imaging of the chest, abdomen and pelvis was performed following the standard protocol during bolus administration of intravenous contrast. CONTRAST:  100mL OMNIPAQUE IOHEXOL 300 MG/ML  SOLN COMPARISON:  CT scan of Jul 29, 2014. FINDINGS: CT CHEST FINDINGS Cardiovascular: Atherosclerosis of thoracic aorta is noted without aneurysm or dissection. Normal cardiac size. No pericardial effusion. Mediastinum/Nodes: No enlarged mediastinal, hilar, or axillary lymph nodes. Thyroid gland, trachea, and esophagus demonstrate no significant findings. Lungs/Pleura: Mild right pneumothorax is noted anteriorly. Mild left basilar subsegmental atelectasis is noted. Probable moderate size right hemothorax is noted with adjacent atelectasis of the right upper and lower lobes. Musculoskeletal: Large amount of subcutaneous emphysema is seen in the right supraclavicular and chest wall regions. Mildly displaced fractures are seen involving the right second, third, fourth, fifth, 6, seventh, eighth, and ninth ribs. CT ABDOMEN PELVIS FINDINGS Hepatobiliary: No gallstones are noted. Nodular hepatic contours are noted concerning for possible hepatic cirrhosis. No biliary dilatation is noted. No focal hepatic lesion is noted. Pancreas: Unremarkable. No pancreatic ductal dilatation or surrounding inflammatory changes. Spleen: Normal in size without focal abnormality. Adrenals/Urinary Tract: Adrenal glands are unremarkable. Kidneys are normal, without renal calculi, focal lesion, or hydronephrosis. Bladder is unremarkable. Stomach/Bowel: Stomach is within normal limits. Appendix appears normal. No evidence of bowel wall thickening, distention, or inflammatory changes. Vascular/Lymphatic: Aortic atherosclerosis. No enlarged abdominal or pelvic lymph  nodes. Collateral veins are seen in the left upper quadrant suggesting portal hypertension. Reproductive: Prostate is unremarkable. Other: No abdominal wall hernia or abnormality. No abdominopelvic ascites. Musculoskeletal: No acute or significant osseous findings. IMPRESSION: Mild right anterior pneumothorax is noted with moderate size right hemothorax. Adjacent subsegmental atelectasis of right upper and lower lobes are noted. Multiple mildly displaced right rib fractures are noted. Large amount of subcutaneous emphysema is seen over right lateral chest wall and supraclavicular region. Critical Value/emergent results were called by telephone at the time of interpretation on 04/23/2018 at 3:37 pm to Dr. Emelda BrothersKahut , who verbally acknowledged these results. Findings consistent with hepatic cirrhosis with enlarged collateral veins suggesting portal hypertension. No traumatic injury is noted in the abdomen or pelvis. Aortic Atherosclerosis (ICD10-I70.0). Electronically Signed   By: Lupita RaiderJames  Green Jr, M.D.   On: 04/23/2018 15:37   Ct Cervical Spine Wo Contrast  Result Date: 04/23/2018 CLINICAL DATA:  63 year old male with a history of fall from a ladder EXAM: CT HEAD WITHOUT CONTRAST CT CERVICAL SPINE WITHOUT CONTRAST TECHNIQUE: Multidetector CT imaging of the head and cervical spine was performed following the standard protocol without intravenous contrast.  Multiplanar CT image reconstructions of the cervical spine were also generated. COMPARISON:  None. FINDINGS: CT HEAD FINDINGS Brain: No acute intracranial hemorrhage. No midline shift or mass effect. Gray-white differentiation maintained. Unremarkable appearance of the ventricular system. Vascular: Vascular Skull: No acute fracture.  No aggressive bone lesion identified. Sinuses/Orbits: Unremarkable appearance of the orbits. Mastoid air cells clear. No middle ear effusion. No significant sinus disease. Other: None CT CERVICAL SPINE FINDINGS Alignment: Craniocervical  junction aligned. Anatomic alignment of the cervical elements. No subluxation. Skull base and vertebrae: No acute fracture at the skullbase. Vertebral body heights relatively maintained. No acute fracture identified. Soft tissues and spinal canal: Subcutaneous/myofacial gas of the right right neck and right chest extending from the inferior aspects of the exam along the musculature nearly to the skull base. No canal hematoma. No adenopathy. No radiopaque metallic foreign body Disc levels:  Disc space narrowing throughout the cervical spine. C2-C3: Mild disc disease without significant canal narrowing or foraminal narrowing. C3-C4: Mild disc disease and uncovertebral joint disease without significant foraminal narrowing or canal narrowing. C4-C5: No significant canal narrowing or foraminal narrowing with mild disc disease. C5-C6: Disc osteophyte complex with posterior disc bulge/protrusion, eccentric to the left appearing to contact the ventral thecal sac. Associated uncovertebral joint disease bilaterally with facet disease contributing to bilateral foraminal narrowing. C6-C7: Posterior disc osteophyte complex with uncovertebral joint disease contributing to mild bilateral foraminal narrowing. No significant bony canal narrowing. C7-T1: No significant canal narrowing or foraminal narrowing. Upper chest: Tiny right apical pneumothorax. Fractures of the posterior right second and third ribs at the articulation. Displaced anterior right first rib fracture. Myofacial/subcutaneous gas at the right chest and right neck. Mixed interstitial and airspace disease at the right lung apex. No left pneumothorax. Other: Mild carotid calcifications. IMPRESSION: Head CT: Negative head CT for acute intracranial abnormality. Cervical CT: No acute fracture or malalignment of the cervical spine. Multilevel degenerative changes, as above. Worst degree of disc disease present at C5-C6 where there is posterior disc bulge/protrusion,  eccentric to the left that may contact the ventral thecal sac. **An incidental finding of potential clinical significance has been found. Small right pneumothorax, better characterized on contemporaneous chest CT with associated right first, second, third rib fractures and subcutaneous/myofacial emphysema.** These results were called by telephone at the time of interpretation on 04/23/2018 at 3:33 pm to Dr. Blane Ohara. Electronically Signed   By: Gilmer Mor D.O.   On: 04/23/2018 15:34   Ct Abdomen Pelvis W Contrast  Result Date: 04/23/2018 CLINICAL DATA:  Chest and abdominal pain after fall off ladder. EXAM: CT CHEST, ABDOMEN, AND PELVIS WITH CONTRAST TECHNIQUE: Multidetector CT imaging of the chest, abdomen and pelvis was performed following the standard protocol during bolus administration of intravenous contrast. CONTRAST:  OMNIPAQUE IOHEXOL 300 MG/ML  SOLN COMPARISON:  CT scan of Jul 29, 2014. FINDINGS: CT CHEST FINDINGS Cardiovascular: Atherosclerosis of thoracic aorta is noted without aneurysm or dissection. Normal cardiac size. No pericardial effusion. Mediastinum/Nodes: No enlarged mediastinal, hilar, or axillary lymph nodes. Thyroid gland, trachea, and esophagus demonstrate no significant findings. Lungs/Pleura: Mild right pneumothorax is noted anteriorly. Mild left basilar subsegmental atelectasis is noted. Probable moderate size right hemothorax is noted with adjacent atelectasis of the right upper and lower lobes. Musculoskeletal: Large amount of subcutaneous emphysema is seen in the right supraclavicular and chest wall regions. Mildly displaced fractures are seen involving the right second, third, fourth, fifth, 6, seventh, eighth, and ninth ribs. CT ABDOMEN PELVIS FINDINGS Hepatobiliary: No  gallstones are noted. Nodular hepatic contours are noted concerning for possible hepatic cirrhosis. No biliary dilatation is noted. No focal hepatic lesion is noted. Pancreas: Unremarkable. No  pancreatic ductal dilatation or surrounding inflammatory changes. Spleen: Normal in size without focal abnormality. Adrenals/Urinary Tract: Adrenal glands are unremarkable. Kidneys are normal, without renal calculi, focal lesion, or hydronephrosis. Bladder is unremarkable. Stomach/Bowel: Stomach is within normal limits. Appendix appears normal. No evidence of bowel wall thickening, distention, or inflammatory changes. Vascular/Lymphatic: Aortic atherosclerosis. No enlarged abdominal or pelvic lymph nodes. Collateral veins are seen in the left upper quadrant suggesting portal hypertension. Reproductive: Prostate is unremarkable. Other: No abdominal wall hernia or abnormality. No abdominopelvic ascites. Musculoskeletal: No acute or significant osseous findings. IMPRESSION: Mild right anterior pneumothorax is noted with moderate size right hemothorax. Adjacent subsegmental atelectasis of right upper and lower lobes are noted. Multiple mildly displaced right rib fractures are noted. Large amount of subcutaneous emphysema is seen over right lateral chest wall and supraclavicular region. Critical Value/emergent results were called by telephone at the time of interpretation on 04/23/2018 at 3:37 pm to Dr. Emelda Brothers , who verbally acknowledged these results. Findings consistent with hepatic cirrhosis with enlarged collateral veins suggesting portal hypertension. No traumatic injury is noted in the abdomen or pelvis. Aortic Atherosclerosis (ICD10-I70.0). Electronically Signed   By: Lupita Raider, M.D.   On: 04/23/2018 15:37   Dg Pelvis Portable  Result Date: 04/23/2018 CLINICAL DATA:  Fall from tree. EXAM: PORTABLE PELVIS 1-2 VIEWS COMPARISON:  None. FINDINGS: There is no evidence of pelvic fracture or diastasis. No pelvic bone lesions are seen. IMPRESSION: Negative. Electronically Signed   By: Lupita Raider, M.D.   On: 04/23/2018 14:23   Dg Chest Port 1 View  Result Date: 04/23/2018 CLINICAL DATA:  Trauma, fall from  tree. EXAM: PORTABLE CHEST 1 VIEW COMPARISON:  Chest radiograph February 25, 2016 FINDINGS: Acute appearing RIGHT third through ninth rib fractures. Patchy RIGHT lower lung zone airspace opacity. Mild chronic interstitial changes without pleural effusion. No pneumothorax. Cardiomediastinal silhouette is normal. Calcified aortic arch. RIGHT chest wall subcutaneous gas tracking into neck. IMPRESSION: 1. Acute RIGHT third through ninth rib fractures. No identified pneumothorax though CT would be more sensitive. RIGHT chest wall subcutaneous emphysema. 2. Increasing constant acuity of RIGHT lung base opacity, possible contusion. 3. Acute findings discussed with and reconfirmed by Dr.Tykerria Mccubbins on 04/23/2018 at 2:26 pm. Electronically Signed   By: Awilda Metro M.D.   On: 04/23/2018 14:27    Procedures .Critical Care Performed by: Blane Ohara, MD Authorized by: Blane Ohara, MD   Critical care provider statement:    Critical care time (minutes):  80   Critical care start time:  04/23/2018 2:10 PM   Critical care end time:  04/23/2018 3:30 PM   Critical care time was exclusive of:  Separately billable procedures and treating other patients and teaching time   Critical care was necessary to treat or prevent imminent or life-threatening deterioration of the following conditions:  Trauma   Critical care was time spent personally by me on the following activities:  Discussions with consultants, evaluation of patient's response to treatment, examination of patient, ordering and performing treatments and interventions, ordering and review of laboratory studies, ordering and review of radiographic studies, pulse oximetry, re-evaluation of patient's condition, obtaining history from patient or surrogate and review of old charts   I assumed direction of critical care for this patient from another provider in my specialty: no    Ultrasound ED  FAST Date/Time: 04/23/2018 3:55 PM Performed by: Blane Ohara, MD Authorized by: Blane Ohara, MD  Procedure details:    Indications: blunt abdominal trauma      Assess for:  Intra-abdominal fluid and pericardial effusion    Technique:  Abdominal and cardiac    Images: archived    Study Limitations: body habitus  Abdominal findings:    L kidney:  Visualized   R kidney:  Visualized   Liver:  Visualized   Bladder:  Visualized,    Hepatorenal space visualized: not identified     Splenorenal space: not identified     Rectovesical free fluid: not identified     Splenorenal free fluid: not identified     Hepatorenal space free fluid: not identified   Cardiac findings:    Heart:  Visualized   Pericardial effusion: not identified     (including critical care time)  Medications Ordered in ED Medications  sodium chloride 0.9 % bolus 500 mL (500 mLs Intravenous New Bag/Given 04/23/18 1550)  Tdap (BOOSTRIX) injection 0.5 mL (has no administration in time range)  HYDROmorphone (DILAUDID) injection 1 mg (1 mg Intravenous Given 04/23/18 1404)  iohexol (OMNIPAQUE) 300 MG/ML solution 100 mL (100 mLs Intravenous Contrast Given 04/23/18 1507)  fentaNYL (SUBLIMAZE) injection 100 mcg (100 mcg Intravenous Given 04/23/18 1543)     Initial Impression / Assessment and Plan / ED Course  I have reviewed the triage vital signs and the nursing notes.  Pertinent labs & imaging results that were available during my care of the patient were reviewed by me and considered in my medical decision making (see chart for details).    Patient presents with significant trauma.  Borderline blood pressure for EMS, blood pressure elevated on manual.  Level 2 trauma called due to mechanism, significant pain and concern for multiple rib fractures in elderly patient.  FAST exam negative for free fluid. Portable chest x-ray reviewed multiple rib fractures.  Repeat pain medicines ordered. Recheck repeat pain medicines required, vitals revealed tachypnea. EKG reviewed bundle  branch block.  Blood work leukocytosis likely stress response no sign of infection at this time. Discussed with radiology multiple rib fractures, hemothorax and small pneumothorax.  Nasal cannula oxygen ordered.  Discussed with trauma surgery for admission to the ICU.  The patients results and plan were reviewed and discussed.   Any x-rays performed were independently reviewed by myself.   Differential diagnosis were considered with the presenting HPI.  Medications  sodium chloride 0.9 % bolus 500 mL (500 mLs Intravenous New Bag/Given 04/23/18 1550)  Tdap (BOOSTRIX) injection 0.5 mL (has no administration in time range)  HYDROmorphone (DILAUDID) injection 1 mg (1 mg Intravenous Given 04/23/18 1404)  iohexol (OMNIPAQUE) 300 MG/ML solution 100 mL (100 mLs Intravenous Contrast Given 04/23/18 1507)  fentaNYL (SUBLIMAZE) injection 100 mcg (100 mcg Intravenous Given 04/23/18 1543)    Vitals:   04/23/18 1513 04/23/18 1514 04/23/18 1515 04/23/18 1530  BP:   (!) 161/91 (!) 144/90  Pulse: 94  96 99  Resp: (!) 29 (!) 31 (!) 26 (!) 27  Temp:      SpO2: 96%  96% 96%  Weight:      Height:        Final diagnoses:  Fall, initial encounter  Closed fracture of multiple ribs of right side, initial encounter  Contusion of right lung, initial encounter    Admission/ observation were discussed with the admitting physician, patient and/or family and they are comfortable with the plan.  Final Clinical Impressions(s) / ED Diagnoses   Final diagnoses:  Fall, initial encounter  Closed fracture of multiple ribs of right side, initial encounter  Contusion of right lung, initial encounter    ED Discharge Orders    None       Blane Ohara, MD 04/23/18 1558

## 2018-04-23 NOTE — ED Triage Notes (Signed)
Pt arrived by [facility] after pt fell off ladder cutting limbs off tree falling approx 8-12 ft landing on sided. Fire arrived prior to EMS and placed pt on c-collar and spinal board. Pt c/o right pain near lower lobe of lung to top of abdomen; R side tender to palpation; pt has hx of degenerative disc, cirrohois, and hep c. Pt denies LOC. Pt uses oxycodone for back pain. VSS at this time.  EMS vitals:  BP 80/62 130/82  97 on 4L RR 26/min HR 94

## 2018-04-23 NOTE — Plan of Care (Signed)

## 2018-04-23 NOTE — Procedures (Signed)
Chest Tube Insertion Procedure Note  Indications:  Clinically significant R hemopneumothorax  Pre-operative Diagnosis: R hemopneumothorax  Post-operative Diagnosis: R hemopneumothorax  Procedure Details  Informed consent was obtained for the procedure, including sedation.  Risks of lung perforation, hemorrhage, arrhythmia, and adverse drug reaction were discussed.   After sterile skin prep, using standard technique, a 14 French tube was placed in the R anterior axillary line above the nipple level using Seldinger technique. It was attached to a Pleruevac on -20cm suction. A sterile dressing was applied.  Findings: 450cc blood, some air  Estimated Blood Loss:  450 per tube         Specimens:  None              Complications:  None; patient tolerated the procedure well.         Disposition: ED to be admitted         Condition: stable  Francisco Gelinas, MD, MPH, FACS Trauma: [phone] General Surgery: [phone]

## 2018-04-23 NOTE — ED Notes (Signed)
Trauma attending at the bedside

## 2018-04-23 NOTE — ED Notes (Signed)
C-spine cleared by trauma attending and PA. Collar removed.

## 2018-04-23 NOTE — ED Notes (Signed)
Dr. Jodi Mourning bedside performing FAST assessment.

## 2018-04-23 NOTE — ED Notes (Signed)
Patient returned from CT

## 2018-04-23 NOTE — ED Notes (Signed)
Ice chips given to pt per Dr. Jodi Mourning

## 2018-04-23 NOTE — H&P (Signed)
Francisco Floyd Sherrilee Gilles 1955-07-07  [id number].    Chief Complaint/Reason for Consult: Fall  HPI:  This is a 63 yo white male with a history of Hep C and cirrhosis.  He last some someone for this about 6 years ago.  He was taking Lasix and Aldactone, but hasn't taken this since 2014.  He was in a tree today cutting limbs and fell about 12 feet.  He was brought in as a level 2 trauma.   He has been found to have right ribs 2-9 FX with subsequent right hemopneumothorax.  He is sating around 96% on 2L right now.  We have been asked to see him for admission.  He denies pain anywhere else except in his right chest.  He denies LOC or syncope.  ROS: Review of Systems  Constitutional: Negative.   HENT: Negative.   Eyes: Negative.   Respiratory: Negative.   Cardiovascular: Negative.   Gastrointestinal: Negative.        History of cirrhosis and Hep C  Genitourinary: Negative.   Musculoskeletal: Positive for joint pain (chronic neck and joint pains).       Right-sided chest pain  Skin: Negative.   Neurological:       Numbness of his hands  Psychiatric/Behavioral: Negative.     History reviewed. No pertinent family history.  Past Medical History:  Diagnosis Date  . Cirrhosis of liver (HCC)   . Hepatitis C     Past Surgical History:  Procedure Laterality Date  . INCISION AND DRAINAGE ABSCESS     groin  . TONSILLECTOMY      Social History:  reports that he quit smoking about 20 years ago. His smoking use included cigarettes. He does not have any smokeless tobacco history on file. He reports current alcohol use. He reports that he does not use drugs.  Allergies:  Allergies  Allergen Reactions  . Flagyl [Metronidazole Hcl]     Unknown to family.    (Not in a hospital admission)    Physical Exam: Blood pressure (!) 144/90, pulse 99, temperature (!) 97.5 F (36.4 C), resp. rate (!) 27, height 5\' 7"  (1.702 m), weight 100.7 kg, SpO2 96 %. General: pleasant, WD, WN white male who  is laying in bed in NAD HEENT: head is normocephalic, atraumatic.  Sclera are noninjected.  PERRL.  Ears and nose without any masses or lesions. No hemotympanum.  No clear drainage from nose c/w CSF leak.  Mouth is pink and moist.  Lower dentures are out, upper dentures are in place.  Neck is supple.  normal ROM, no bony tenderness.  Heart: regular, rate, and rhythm.  Normal s1,s2. No obvious murmurs, gallops, or rubs noted.  Palpable radial and pedal pulses bilaterally Lungs/chest: CTAB, no wheezes, rhonchi, or rales noted.  Respiratory effort nonlabored.  O2 in place sating 96%.  See procedure note for chest tube placement.  Abut 450cc of bloody output received after placement.  Chest wall tenderness from clavicle down right chest wall.  No ecchymosis noted at this time. Abd: soft, NT, ND, +BS, no masses, hernias, or organomegaly MS: all 4 extremities are symmetrical with no cyanosis, clubbing, or edema.  Several small abrasions noted to his bilateral anterior shins. Skin: warm and dry with no masses, lesions, or rashes Psych: A&Ox3 with an appropriate affect.   Results for orders placed or performed during the hospital encounter of 04/23/18 (from the past 48 hour(s))  Sample to Blood Bank     Status: None  Collection Time: 04/23/18  2:57 PM  Result Value Ref Range   Blood Bank Specimen SAMPLE AVAILABLE FOR TESTING    Sample Expiration      04/24/2018 Performed at [facility], [address] [place], [place] [address]   CBC     Status: Abnormal   Collection Time: 04/23/18  2:59 PM  Result Value Ref Range   WBC 28.4 (H) 4.0 - 10.5 K/uL   RBC 5.18 4.22 - 5.81 MIL/uL   Hemoglobin 16.4 13.0 - 17.0 g/dL   HCT 60.447.6 54.039.0 - 98.152.0 %   MCV 91.9 80.0 - 100.0 fL   MCH 31.7 26.0 - 34.0 pg   MCHC 34.5 30.0 - 36.0 g/dL   RDW 19.113.2 47.811.5 - 29.515.5 %   Platelets 222 150 - 400 K/uL   nRBC 0.0 0.0 - 0.2 %    Comment: Performed at [facility], [address] [place], [place] [address]    Ethanol     Status: None   Collection Time: 04/23/18  2:59 PM  Result Value Ref Range   Alcohol, Ethyl (B) <10 <10 mg/dL    Comment: (NOTE) Lowest detectable limit for serum alcohol is 10 mg/dL. For medical purposes only. Performed at [facility], [address] [place], [place] [address]   Protime-INR     Status: Abnormal   Collection Time: 04/23/18  2:59 PM  Result Value Ref Range   Prothrombin Time 16.0 (H) 11.4 - 15.2 seconds   INR 1.30     Comment: Performed at [facility], [address] [place], [place] [address]   Ct Head Wo Contrast  Result Date: 04/23/2018 CLINICAL DATA:  63 year old male with a history of fall from a ladder EXAM: CT HEAD WITHOUT CONTRAST CT CERVICAL SPINE WITHOUT CONTRAST TECHNIQUE: Multidetector CT imaging of the head and cervical spine was performed following the standard protocol without intravenous contrast. Multiplanar CT image reconstructions of the cervical spine were also generated. COMPARISON:  None. FINDINGS: CT HEAD FINDINGS Brain: No acute intracranial hemorrhage. No midline shift or mass effect. Gray-white differentiation maintained. Unremarkable appearance of the ventricular system. Vascular: Vascular Skull: No acute fracture.  No aggressive bone lesion identified. Sinuses/Orbits: Unremarkable appearance of the orbits. Mastoid air cells clear. No middle ear effusion. No significant sinus disease. Other: None CT CERVICAL SPINE FINDINGS Alignment: Craniocervical junction aligned. Anatomic alignment of the cervical elements. No subluxation. Skull base and vertebrae: No acute fracture at the skullbase. Vertebral body heights relatively maintained. No acute fracture identified. Soft tissues and spinal canal: Subcutaneous/myofacial gas of the right right neck and right chest extending from the inferior aspects of the exam along the musculature nearly to the skull base. No canal hematoma. No adenopathy. No radiopaque metallic foreign body Disc  levels:  Disc space narrowing throughout the cervical spine. C2-C3: Mild disc disease without significant canal narrowing or foraminal narrowing. C3-C4: Mild disc disease and uncovertebral joint disease without significant foraminal narrowing or canal narrowing. C4-C5: No significant canal narrowing or foraminal narrowing with mild disc disease. C5-C6: Disc osteophyte complex with posterior disc bulge/protrusion, eccentric to the left appearing to contact the ventral thecal sac. Associated uncovertebral joint disease bilaterally with facet disease contributing to bilateral foraminal narrowing. C6-C7: Posterior disc osteophyte complex with uncovertebral joint disease contributing to mild bilateral foraminal narrowing. No significant bony canal narrowing. C7-T1: No significant canal narrowing or foraminal narrowing. Upper chest: Tiny right apical pneumothorax. Fractures of the posterior right second and  third ribs at the articulation. Displaced anterior right first rib fracture. Myofacial/subcutaneous gas at the right chest and right neck. Mixed interstitial and airspace disease at the right lung apex. No left pneumothorax. Other: Mild carotid calcifications. IMPRESSION: Head CT: Negative head CT for acute intracranial abnormality. Cervical CT: No acute fracture or malalignment of the cervical spine. Multilevel degenerative changes, as above. Worst degree of disc disease present at C5-C6 where there is posterior disc bulge/protrusion, eccentric to the left that may contact the ventral thecal sac. **An incidental finding of potential clinical significance has been found. Small right pneumothorax, better characterized on contemporaneous chest CT with associated right first, second, third rib fractures and subcutaneous/myofacial emphysema.** These results were called by telephone at the time of interpretation on 04/23/2018 at 3:33 pm to Dr. Blane Ohara. Electronically Signed   By: Gilmer Mor D.O.   On: 04/23/2018  15:34   Ct Chest W Contrast  Result Date: 04/23/2018 CLINICAL DATA:  Chest and abdominal pain after fall off ladder. EXAM: CT CHEST, ABDOMEN, AND PELVIS WITH CONTRAST TECHNIQUE: Multidetector CT imaging of the chest, abdomen and pelvis was performed following the standard protocol during bolus administration of intravenous contrast. CONTRAST:  OMNIPAQUE IOHEXOL 300 MG/ML  SOLN COMPARISON:  CT scan of Jul 29, 2014. FINDINGS: CT CHEST FINDINGS Cardiovascular: Atherosclerosis of thoracic aorta is noted without aneurysm or dissection. Normal cardiac size. No pericardial effusion. Mediastinum/Nodes: No enlarged mediastinal, hilar, or axillary lymph nodes. Thyroid gland, trachea, and esophagus demonstrate no significant findings. Lungs/Pleura: Mild right pneumothorax is noted anteriorly. Mild left basilar subsegmental atelectasis is noted. Probable moderate size right hemothorax is noted with adjacent atelectasis of the right upper and lower lobes. Musculoskeletal: Large amount of subcutaneous emphysema is seen in the right supraclavicular and chest wall regions. Mildly displaced fractures are seen involving the right second, third, fourth, fifth, 6, seventh, eighth, and ninth ribs. CT ABDOMEN PELVIS FINDINGS Hepatobiliary: No gallstones are noted. Nodular hepatic contours are noted concerning for possible hepatic cirrhosis. No biliary dilatation is noted. No focal hepatic lesion is noted. Pancreas: Unremarkable. No pancreatic ductal dilatation or surrounding inflammatory changes. Spleen: Normal in size without focal abnormality. Adrenals/Urinary Tract: Adrenal glands are unremarkable. Kidneys are normal, without renal calculi, focal lesion, or hydronephrosis. Bladder is unremarkable. Stomach/Bowel: Stomach is within normal limits. Appendix appears normal. No evidence of bowel wall thickening, distention, or inflammatory changes. Vascular/Lymphatic: Aortic atherosclerosis. No enlarged abdominal or pelvic lymph  nodes. Collateral veins are seen in the left upper quadrant suggesting portal hypertension. Reproductive: Prostate is unremarkable. Other: No abdominal wall hernia or abnormality. No abdominopelvic ascites. Musculoskeletal: No acute or significant osseous findings. IMPRESSION: Mild right anterior pneumothorax is noted with moderate size right hemothorax. Adjacent subsegmental atelectasis of right upper and lower lobes are noted. Multiple mildly displaced right rib fractures are noted. Large amount of subcutaneous emphysema is seen over right lateral chest wall and supraclavicular region. Critical Value/emergent results were called by telephone at the time of interpretation on 04/23/2018 at 3:37 pm to Dr. Emelda Brothers , who verbally acknowledged these results. Findings consistent with hepatic cirrhosis with enlarged collateral veins suggesting portal hypertension. No traumatic injury is noted in the abdomen or pelvis. Aortic Atherosclerosis (ICD10-I70.0). Electronically Signed   By: Lupita Raider, M.D.   On: 04/23/2018 15:37   Ct Cervical Spine Wo Contrast  Result Date: 04/23/2018 CLINICAL DATA:  63 year old male with a history of fall from a ladder EXAM: CT HEAD WITHOUT CONTRAST CT CERVICAL SPINE WITHOUT CONTRAST  TECHNIQUE: Multidetector CT imaging of the head and cervical spine was performed following the standard protocol without intravenous contrast. Multiplanar CT image reconstructions of the cervical spine were also generated. COMPARISON:  None. FINDINGS: CT HEAD FINDINGS Brain: No acute intracranial hemorrhage. No midline shift or mass effect. Gray-white differentiation maintained. Unremarkable appearance of the ventricular system. Vascular: Vascular Skull: No acute fracture.  No aggressive bone lesion identified. Sinuses/Orbits: Unremarkable appearance of the orbits. Mastoid air cells clear. No middle ear effusion. No significant sinus disease. Other: None CT CERVICAL SPINE FINDINGS Alignment: Craniocervical  junction aligned. Anatomic alignment of the cervical elements. No subluxation. Skull base and vertebrae: No acute fracture at the skullbase. Vertebral body heights relatively maintained. No acute fracture identified. Soft tissues and spinal canal: Subcutaneous/myofacial gas of the right right neck and right chest extending from the inferior aspects of the exam along the musculature nearly to the skull base. No canal hematoma. No adenopathy. No radiopaque metallic foreign body Disc levels:  Disc space narrowing throughout the cervical spine. C2-C3: Mild disc disease without significant canal narrowing or foraminal narrowing. C3-C4: Mild disc disease and uncovertebral joint disease without significant foraminal narrowing or canal narrowing. C4-C5: No significant canal narrowing or foraminal narrowing with mild disc disease. C5-C6: Disc osteophyte complex with posterior disc bulge/protrusion, eccentric to the left appearing to contact the ventral thecal sac. Associated uncovertebral joint disease bilaterally with facet disease contributing to bilateral foraminal narrowing. C6-C7: Posterior disc osteophyte complex with uncovertebral joint disease contributing to mild bilateral foraminal narrowing. No significant bony canal narrowing. C7-T1: No significant canal narrowing or foraminal narrowing. Upper chest: Tiny right apical pneumothorax. Fractures of the posterior right second and third ribs at the articulation. Displaced anterior right first rib fracture. Myofacial/subcutaneous gas at the right chest and right neck. Mixed interstitial and airspace disease at the right lung apex. No left pneumothorax. Other: Mild carotid calcifications. IMPRESSION: Head CT: Negative head CT for acute intracranial abnormality. Cervical CT: No acute fracture or malalignment of the cervical spine. Multilevel degenerative changes, as above. Worst degree of disc disease present at C5-C6 where there is posterior disc bulge/protrusion,  eccentric to the left that may contact the ventral thecal sac. **An incidental finding of potential clinical significance has been found. Small right pneumothorax, better characterized on contemporaneous chest CT with associated right first, second, third rib fractures and subcutaneous/myofacial emphysema.** These results were called by telephone at the time of interpretation on 04/23/2018 at 3:33 pm to Dr. Blane Ohara. Electronically Signed   By: Gilmer Mor D.O.   On: 04/23/2018 15:34   Ct Abdomen Pelvis W Contrast  Result Date: 04/23/2018 CLINICAL DATA:  Chest and abdominal pain after fall off ladder. EXAM: CT CHEST, ABDOMEN, AND PELVIS WITH CONTRAST TECHNIQUE: Multidetector CT imaging of the chest, abdomen and pelvis was performed following the standard protocol during bolus administration of intravenous contrast. CONTRAST:  OMNIPAQUE IOHEXOL 300 MG/ML  SOLN COMPARISON:  CT scan of Jul 29, 2014. FINDINGS: CT CHEST FINDINGS Cardiovascular: Atherosclerosis of thoracic aorta is noted without aneurysm or dissection. Normal cardiac size. No pericardial effusion. Mediastinum/Nodes: No enlarged mediastinal, hilar, or axillary lymph nodes. Thyroid gland, trachea, and esophagus demonstrate no significant findings. Lungs/Pleura: Mild right pneumothorax is noted anteriorly. Mild left basilar subsegmental atelectasis is noted. Probable moderate size right hemothorax is noted with adjacent atelectasis of the right upper and lower lobes. Musculoskeletal: Large amount of subcutaneous emphysema is seen in the right supraclavicular and chest wall regions. Mildly displaced fractures are seen  involving the right second, third, fourth, fifth, 6, seventh, eighth, and ninth ribs. CT ABDOMEN PELVIS FINDINGS Hepatobiliary: No gallstones are noted. Nodular hepatic contours are noted concerning for possible hepatic cirrhosis. No biliary dilatation is noted. No focal hepatic lesion is noted. Pancreas: Unremarkable. No  pancreatic ductal dilatation or surrounding inflammatory changes. Spleen: Normal in size without focal abnormality. Adrenals/Urinary Tract: Adrenal glands are unremarkable. Kidneys are normal, without renal calculi, focal lesion, or hydronephrosis. Bladder is unremarkable. Stomach/Bowel: Stomach is within normal limits. Appendix appears normal. No evidence of bowel wall thickening, distention, or inflammatory changes. Vascular/Lymphatic: Aortic atherosclerosis. No enlarged abdominal or pelvic lymph nodes. Collateral veins are seen in the left upper quadrant suggesting portal hypertension. Reproductive: Prostate is unremarkable. Other: No abdominal wall hernia or abnormality. No abdominopelvic ascites. Musculoskeletal: No acute or significant osseous findings. IMPRESSION: Mild right anterior pneumothorax is noted with moderate size right hemothorax. Adjacent subsegmental atelectasis of right upper and lower lobes are noted. Multiple mildly displaced right rib fractures are noted. Large amount of subcutaneous emphysema is seen over right lateral chest wall and supraclavicular region. Critical Value/emergent results were called by telephone at the time of interpretation on 04/23/2018 at 3:37 pm to Dr. Emelda BrothersKahut , who verbally acknowledged these results. Findings consistent with hepatic cirrhosis with enlarged collateral veins suggesting portal hypertension. No traumatic injury is noted in the abdomen or pelvis. Aortic Atherosclerosis (ICD10-I70.0). Electronically Signed   By: Lupita RaiderJames  Green Jr, M.D.   On: 04/23/2018 15:37   Dg Pelvis Portable  Result Date: 04/23/2018 CLINICAL DATA:  Fall from tree. EXAM: PORTABLE PELVIS 1-2 VIEWS COMPARISON:  None. FINDINGS: There is no evidence of pelvic fracture or diastasis. No pelvic bone lesions are seen. IMPRESSION: Negative. Electronically Signed   By: Lupita RaiderJames  Green Jr, M.D.   On: 04/23/2018 14:23   Dg Chest Port 1 View  Result Date: 04/23/2018 CLINICAL DATA:  Trauma, fall from  tree. EXAM: PORTABLE CHEST 1 VIEW COMPARISON:  Chest radiograph February 25, 2016 FINDINGS: Acute appearing RIGHT third through ninth rib fractures. Patchy RIGHT lower lung zone airspace opacity. Mild chronic interstitial changes without pleural effusion. No pneumothorax. Cardiomediastinal silhouette is normal. Calcified aortic arch. RIGHT chest wall subcutaneous gas tracking into neck. IMPRESSION: 1. Acute RIGHT third through ninth rib fractures. No identified pneumothorax though CT would be more sensitive. RIGHT chest wall subcutaneous emphysema. 2. Increasing constant acuity of RIGHT lung base opacity, possible contusion. 3. Acute findings discussed with and reconfirmed by Dr.JOSHUA ZAVITZ on 04/23/2018 at 2:26 pm. Electronically Signed   By: Awilda Metroourtnay  Bloomer M.D.   On: 04/23/2018 14:27      Assessment/Plan Fall from tree Right rib FX 2-9 - pain control, IS, PT/OT ordered, ambulate with assist Right hemopneumothorax - CT in place with 450cc of bloody output.  Placement CXR pending, repeat CXR ordered for in the am.  Place on suction for now. Hep C/Cirrhosis - hasn't been on lasix or aldactone in 6 years.  INR is 1.3, will hold chemical prophylaxis for now secondary to cirrhosis and hemothorax. FEN - regular diet/IVFs for tonight at least at 75cc/hr, awaiting CMET still VTE - SCDs/hold chemical prophylaxis secondary to cirrhosis and hemothorax ID - none needed, CBC 28K on admission, will follow  Letha CapeKelly E Alleene Stoy, [facility] 04/23/2018, 4:18 PM Pager: [phone]

## 2018-04-23 NOTE — ED Notes (Signed)
Xray arrived to room while ED provider still assessing pt at bedside.

## 2018-04-24 ENCOUNTER — Inpatient Hospital Stay (HOSPITAL_COMMUNITY): Payer: Medicare HMO

## 2018-04-24 LAB — BASIC METABOLIC PANEL
Anion gap: 12 (ref 5–15)
Anion gap: 9 (ref 5–15)
BUN: 11 mg/dL (ref 8–23)
BUN: 12 mg/dL (ref 8–23)
CHLORIDE: 104 mmol/L (ref 98–111)
CO2: 21 mmol/L — ABNORMAL LOW (ref 22–32)
CO2: 21 mmol/L — ABNORMAL LOW (ref 22–32)
Calcium: 8.4 mg/dL — ABNORMAL LOW (ref 8.9–10.3)
Calcium: 8.6 mg/dL — ABNORMAL LOW (ref 8.9–10.3)
Chloride: 106 mmol/L (ref 98–111)
Creatinine, Ser: 0.67 mg/dL (ref 0.61–1.24)
Creatinine, Ser: 0.91 mg/dL (ref 0.61–1.24)
GFR calc Af Amer: >60 mL/min (ref >60)
GFR calc non Af Amer: >60 mL/min (ref >60)
GFR calc non Af Amer: >60 mL/min (ref >60)
Glucose, Bld: 110 mg/dL — ABNORMAL HIGH (ref 70–99)
Glucose, Bld: 124 mg/dL — ABNORMAL HIGH (ref 70–99)
Potassium: 4.9 mmol/L (ref 3.5–5.1)
Potassium: 4.3 mmol/L (ref 3.5–5.1)
SODIUM: 136 mmol/L (ref 135–145)
Sodium: 137 mmol/L (ref 135–145)

## 2018-04-24 LAB — HIV ANTIBODY (ROUTINE TESTING W REFLEX): HIV Screen 4th Generation wRfx: NONREACTIVE

## 2018-04-24 LAB — CBC
HCT: 42.7 % (ref 39.0–52.0)
Hemoglobin: 14.8 g/dL (ref 13.0–17.0)
MCH: 31.8 pg (ref 26.0–34.0)
MCHC: 34.7 g/dL (ref 30.0–36.0)
MCV: 91.8 fL (ref 80.0–100.0)
Platelets: 222 10*3/uL (ref 150–400)
RBC: 4.65 MIL/uL (ref 4.22–5.81)
RDW: 13.6 % (ref 11.5–15.5)
WBC: 23.6 10*3/uL — ABNORMAL HIGH (ref 4.0–10.5)
nRBC: 0 % (ref 0.0–0.2)

## 2018-04-24 LAB — CDS SEROLOGY

## 2018-04-24 MED ORDER — IBUPROFEN 400 MG PO TABS
400.0000 mg | ORAL_TABLET | Freq: Three times a day (TID) | ORAL | Status: DC
  Administered 2018-04-24 – 2018-05-04 (×31): 400 mg via ORAL
  Filled 2018-04-24 (×12): qty 1
  Filled 2018-04-24: qty 2
  Filled 2018-04-24 (×14): qty 1
  Filled 2018-04-24 (×4): qty 2

## 2018-04-24 MED ORDER — OXYCODONE HCL 5 MG PO TABS
15.0000 mg | ORAL_TABLET | ORAL | Status: DC | PRN
  Administered 2018-04-24 – 2018-05-04 (×60): 20 mg via ORAL
  Filled 2018-04-24 (×21): qty 4
  Filled 2018-04-24: qty 3
  Filled 2018-04-24 (×40): qty 4

## 2018-04-24 MED ORDER — METHOCARBAMOL 750 MG PO TABS
750.0000 mg | ORAL_TABLET | Freq: Three times a day (TID) | ORAL | Status: DC
  Administered 2018-04-24 – 2018-04-29 (×18): 750 mg via ORAL
  Filled 2018-04-24 (×3): qty 1
  Filled 2018-04-24: qty 2
  Filled 2018-04-24 (×5): qty 1
  Filled 2018-04-24 (×3): qty 2
  Filled 2018-04-24 (×5): qty 1

## 2018-04-24 NOTE — Plan of Care (Signed)

## 2018-04-24 NOTE — Evaluation (Signed)
Physical Therapy Evaluation Patient Details Name: Francisco Floyd MRN: [id number] DOB: Mar 03, 1956 Today's Date: 04/24/2018   History of Present Illness  63 yo admitted after fall from tree with Rt rib fx 2-9 with HPTX. PMhx: hep C and cirrhosis  Clinical Impression  Pt pleasant and willing to mobilize but fearful of pain. Pt with pain limiting transfers with education for sequence and progression. Pt with decreased transfers, gait functional mobility and cardiopulmonary status who will benefit from acute therapy to maximize independence and function to decrease burden of care. Pt with SpO2 88% with mobility on 2L and required 3L for 92% with gait with HR 125 with transfers and 120 with gait.  Recommend daily mobility with nursing staff and educated pt for splinting with pillow with coughing     Follow Up Recommendations Home health PT;Supervision - Intermittent(if able to progress transfers)    Equipment Recommendations  3in1 (PT)    Recommendations for Other Services       Precautions / Restrictions Precautions Precautions: Fall Precaution Comments: chest tube      Mobility  Bed Mobility Overal bed mobility: Needs Assistance Bed Mobility: Rolling;Sidelying to Sit Rolling: Mod assist Sidelying to sit: Mod assist       General bed mobility comments: cues for sequence with increased time to roll and rise from side   Transfers Overall transfer level: Needs assistance   Transfers: Sit to/from Stand Sit to Stand: Min assist;From elevated surface         General transfer comment: min assist with cues for hand placement to rise from surface  Ambulation/Gait Ambulation/Gait assistance: Min guard Gait Distance (Feet): 150 Feet Assistive device: Rolling walker (2 wheeled) Gait Pattern/deviations: Step-through pattern;Decreased stride length   Gait velocity interpretation: >2.62 ft/sec, indicative of community ambulatory General Gait Details: pt very stiff and cautious  with gait with min assist for turning RW, cues for posture and breathing technique  Stairs            Wheelchair Mobility    Modified Rankin (Stroke Patients Only)       Balance Overall balance assessment: Mild deficits observed, not formally tested                                           Pertinent Vitals/Pain Pain Assessment: 0-10 Pain Score: 6  Pain Location: right chest Pain Descriptors / Indicators: Aching;Discomfort;Constant Pain Intervention(s): Limited activity within patient's tolerance;Premedicated before session;Monitored during session;Repositioned    Home Living Family/patient expects to be discharged to:: Private residence Living Arrangements: Alone Available Help at Discharge: Family;Available PRN/intermittently Type of Home: Mobile home Home Access: Stairs to enter   Entrance Stairs-Number of Steps: 4 Home Layout: One level Home Equipment: Walker - 2 wheels;Cane - single point;Wheelchair - manual Additional Comments: equipment handed down from family    Prior Function Level of Independence: Independent               Hand Dominance        Extremity/Trunk Assessment   Upper Extremity Assessment Upper Extremity Assessment: Defer to OT evaluation    Lower Extremity Assessment Lower Extremity Assessment: Overall WFL for tasks assessed    Cervical / Trunk Assessment Cervical / Trunk Assessment: Other exceptions Cervical / Trunk Exceptions: guarding and stiff due to rib fx  Communication   Communication: No difficulties  Cognition Arousal/Alertness: Awake/alert Behavior During Therapy: WFL for tasks  assessed/performed Overall Cognitive Status: Within Functional Limits for tasks assessed                                        General Comments      Exercises     Assessment/Plan    PT Assessment Patient needs continued PT services  PT Problem List Decreased activity tolerance;Decreased range of  motion;Decreased mobility;Cardiopulmonary status limiting activity;Decreased safety awareness       PT Treatment Interventions DME instruction;Functional mobility training;Patient/family education;Gait training;Therapeutic activities;Stair training;Therapeutic exercise    PT Goals (Current goals can be found in the Care Plan section)  Acute Rehab PT Goals Patient Stated Goal: return home PT Goal Formulation: With patient Time For Goal Achievement: 05/08/18 Potential to Achieve Goals: Good    Frequency Min 3X/week   Barriers to discharge Decreased caregiver support      Co-evaluation               AM-PAC PT "6 Clicks" Mobility  Outcome Measure Help needed turning from your back to your side while in a flat bed without using bedrails?: A Lot Help needed moving from lying on your back to sitting on the side of a flat bed without using bedrails?: A Lot Help needed moving to and from a bed to a chair (including a wheelchair)?: A Little Help needed standing up from a chair using your arms (e.g., wheelchair or bedside chair)?: A Little Help needed to walk in hospital room?: A Little Help needed climbing 3-5 steps with a railing? : A Lot 6 Click Score: 15    End of Session Equipment Utilized During Treatment: Oxygen Activity Tolerance: Patient tolerated treatment well Patient left: Other (comment)(with OT in bathroom) Nurse Communication: Mobility status PT Visit Diagnosis: Other abnormalities of gait and mobility (R26.89);Difficulty in walking, not elsewhere classified (R26.2)    Time: [id number] PT Time Calculation (min) (ACUTE ONLY): 33 min   Charges:   PT Evaluation $PT Eval Moderate Complexity: 1 Mod PT Treatments $Gait Training: 8-22 mins        Cregg Jutte Abner Greenspanabor Bernardina Cacho, PT Acute Rehabilitation Services Pager: [phone] Office: [phone]   Enedina FinnerMaija B Midge Momon 04/24/2018, 9:38 AM

## 2018-04-24 NOTE — Evaluation (Signed)
Occupational Therapy Evaluation Patient Details Name: Francisco Floyd MRN: [id number] DOB: Jul 30, 1955 Today's Date: 04/24/2018    History of Present Illness 63 yo admitted after fall from tree with Rt rib fx 2-9 with HPTX. PMhx: hep C and cirrhosis   Clinical Impression   Patient pleasant, handoff from PT.  Admitted for above, PTA independent with ADLs, IADLs, not driving.  Patient completes mobility with min assist, toilet transfers with min assist, UB ADLs with mod-max assist, LB ADLs with max assist.  He is limited by activity tolerance, pain, anxiety, decreased ROM of dominant R UE, and cardiopulmonary status.  Patient encouraged use of IS, demonstrates good understanding of use but poor tolerance (up to 500 only).  Patient will benefit from continued OT services while admitted, believe patient will progress well and after dc at [facility] level in order to optimize independence and safety with ADLs/mobility.     Follow Up Recommendations  Home health OT;Supervision/Assistance - 24 hour    Equipment Recommendations  3 in 1 bedside commode    Recommendations for Other Services       Precautions / Restrictions Precautions Precautions: Fall Precaution Comments: chest tube Restrictions Weight Bearing Restrictions: No      Mobility Bed Mobility Overal bed mobility: Needs Assistance Bed Mobility: Rolling;Sidelying to Sit Rolling: Mod assist Sidelying to sit: Mod assist       General bed mobility comments: OOB with PT   Transfers Overall transfer level: Needs assistance Equipment used: Rolling walker (2 wheeled) Transfers: Sit to/from Stand Sit to Stand: Min assist;From elevated surface         General transfer comment: min assist to power up into standing, cueing for hand placement and safety     Balance Overall balance assessment: Needs assistance Sitting-balance support: No upper extremity supported;Feet supported Sitting balance-Leahy Scale: Fair Sitting balance -  Comments: statically, limited dynamically due to pain    Standing balance support: During functional activity;Single extremity supported Standing balance-Leahy Scale: Fair Standing balance comment: reliant on UE support                           ADL either performed or assessed with clinical judgement   ADL Overall ADL's : Needs assistance/impaired     Grooming: Minimal assistance;Sitting   Upper Body Bathing: Moderate assistance;Sitting   Lower Body Bathing: Moderate assistance;Sit to/from stand   Upper Body Dressing : Maximal assistance;Sitting   Lower Body Dressing: Maximal assistance;Sit to/from stand   Toilet Transfer: Minimal assistance;Ambulation;BSC;RW;Cueing for sequencing;Cueing for safety           Functional mobility during ADLs: Minimal assistance;Rolling walker;Cueing for sequencing;Cueing for safety General ADL Comments: pt guarded and anxious throughout mobility, requires cueing for safety and sequencing; poor walker mgmt      Vision Baseline Vision/History: Wears glasses(contacts) Wears Glasses: At all times Patient Visual Report: No change from baseline Vision Assessment?: No apparent visual deficits Additional Comments: further assessment needed     Perception     Praxis      Pertinent Vitals/Pain Pain Assessment: 0-10 Pain Score: 6  Pain Location: right chest Pain Descriptors / Indicators: Aching;Discomfort;Constant Pain Intervention(s): Limited activity within patient's tolerance;Repositioned     Hand Dominance Right   Extremity/Trunk Assessment Upper Extremity Assessment Upper Extremity Assessment: RUE deficits/detail;LUE deficits/detail RUE Deficits / Details: reports hx of shoulder limiting ROM, AROM limited due to pain to 20-30 FF but PROM WFL to 90 FF; gross grasp 3+/5 RUE: Unable to  fully assess due to pain RUE Sensation: (history of numbness fingers) RUE Coordination: decreased fine motor;decreased gross motor LUE  Deficits / Details: WFL, grossly; limited overhead range due to pain and pulling on R side; grasp 3+/5  LUE Sensation: (reports hx numbness fingers) LUE Coordination: WNL   Lower Extremity Assessment Lower Extremity Assessment: Defer to PT evaluation   Cervical / Trunk Assessment Cervical / Trunk Assessment: Other exceptions Cervical / Trunk Exceptions: guarding and stiff due to rib fx   Communication Communication Communication: No difficulties   Cognition Arousal/Alertness: Awake/alert Behavior During Therapy: WFL for tasks assessed/performed;Anxious Overall Cognitive Status: Within Functional Limits for tasks assessed                                 General Comments: reports decreased STM   General Comments  Pt on 2-3 L supplemental oxgyen via [place] with saturations ranging from 88-92.  Encouraged IS use x 10/hr; encouragement and cueing for increased breath up to 500 only.     Exercises     Shoulder Instructions      Home Living Family/patient expects to be discharged to:: Private residence Living Arrangements: Alone Available Help at Discharge: Family;Available PRN/intermittently Type of Home: Mobile home Home Access: Stairs to enter Entrance Stairs-Number of Steps: 4   Home Layout: One level     Bathroom Shower/Tub: Chief Strategy OfficerTub/shower unit   Bathroom Toilet: Standard     Home Equipment: Environmental consultantWalker - 2 wheels;Cane - single point;Wheelchair - manual   Additional Comments: equipment handed down from family      Prior Functioning/Environment Level of Independence: Independent        Comments: - driving, independent IADLs (son assists with driving)         OT Problem List: Decreased strength;Decreased range of motion;Decreased activity tolerance;Impaired balance (sitting and/or standing);Decreased coordination;Decreased safety awareness;Decreased knowledge of use of DME or AE;Decreased knowledge of precautions;Pain;Impaired UE functional use      OT  Treatment/Interventions: Self-care/ADL training;Therapeutic exercise;Energy conservation;DME and/or AE instruction;Therapeutic activities;Patient/family education;Balance training    OT Goals(Current goals can be found in the care plan section) Acute Rehab OT Goals Patient Stated Goal: return home OT Goal Formulation: With patient Time For Goal Achievement: 05/08/18 Potential to Achieve Goals: Good  OT Frequency: Min 2X/week   Barriers to D/C:            Co-evaluation              AM-PAC OT "6 Clicks" Daily Activity     Outcome Measure Help from another person eating meals?: A Little Help from another person taking care of personal grooming?: A Little Help from another person toileting, which includes using toliet, bedpan, or urinal?: A Lot Help from another person bathing (including washing, rinsing, drying)?: A Lot Help from another person to put on and taking off regular upper body clothing?: A Lot Help from another person to put on and taking off regular lower body clothing?: A Lot 6 Click Score: 14   End of Session Equipment Utilized During Treatment: Rolling walker;Oxygen Nurse Communication: Mobility status;Other (comment)(oxgyen level)  Activity Tolerance: Patient limited by pain Patient left: in chair;with call bell/phone within reach  OT Visit Diagnosis: Other abnormalities of gait and mobility (R26.89);Pain;Muscle weakness (generalized) (M62.81) Pain - Right/Left: Right Pain - part of body: (ribs)                Time: 1610-9604: 0933-0952 OT Time Calculation (min):  19 min Charges:  OT General Charges $OT Visit: 1 Visit OT Evaluation $OT Eval Moderate Complexity: 1 Mod  Chancy Milroy, [place] Acute Rehabilitation Services Pager [phone] Office [phone]   Chancy Milroy 04/24/2018, 10:12 AM

## 2018-04-24 NOTE — Progress Notes (Signed)
Patient ID: Francisco Floyd, male   DOB: 02/22/1956, 64 y.o.   MRN: [id number]       Subjective: Pt's pain isn't terribly controlled as he tells [place] today he takes 15mg  of oxy at home q 4 hrs so what we are giving isn't enough.  Otherwise no complaints.  Eating and drinking well.  Objective: Vital signs in last 24 hours: Temp:  [97.5 F (36.4 C)-98.9 F (37.2 C)] 98.6 F (37 C) (01/28 0358) Pulse Rate:  [88-104] 99 (01/28 0358) Resp:  [18-34] 18 (01/28 0358) BP: (116-168)/(78-100) 138/78 (01/28 0358) SpO2:  [92 %-97 %] 97 % (01/28 0358) Weight:  [100.7 kg] 100.7 kg (01/27 1443) Last BM Date: 04/23/18  Intake/Output from previous day: 01/27 0701 - 01/28 0700 In: 962.1 [I.V.:962.1] Out: 710 [Chest Tube:710] Intake/Output this shift: No intake/output data recorded.  PE: Gen: laying in bed in NAD Heart: regular Lungs: CTAB, CT with 800cc out total since placement, serosang output.  No airleak.  Right chest wall is very tender as expected. Abd: soft, NT, ND Ext: MAE, NVI  Lab Results:  Recent Labs    04/23/18 1459 04/24/18 0438  WBC 28.4* 23.6*  HGB 16.4 14.8  HCT 47.6 42.7  PLT 222 222   BMET Recent Labs    04/23/18 1459 04/24/18 0438  NA 137 137  K 3.8 4.9  CL 106 104  CO2 20* 21*  GLUCOSE 143* 110*  BUN 9 11  CREATININE 0.87 0.67  CALCIUM 8.9 8.4*   PT/INR Recent Labs    04/23/18 1459  LABPROT 16.0*  INR 1.30   CMP     Component Value Date/Time   NA 137 04/24/2018 0438   K 4.9 04/24/2018 0438   CL 104 04/24/2018 0438   CO2 21 (L) 04/24/2018 0438   GLUCOSE 110 (H) 04/24/2018 0438   BUN 11 04/24/2018 0438   CREATININE 0.67 04/24/2018 0438   CREATININE 0.68 12/15/2011 1350   CALCIUM 8.4 (L) 04/24/2018 0438   PROT 6.5 04/23/2018 1459   ALBUMIN 3.4 (L) 04/23/2018 1459   AST 96 (H) 04/23/2018 1459   ALT 49 (H) 04/23/2018 1459   ALKPHOS 76 04/23/2018 1459   BILITOT 1.1 04/23/2018 1459   GFRNONAA >60 04/24/2018 0438   GFRNONAA >89 12/15/2011  1350   GFRAA >60 04/24/2018 0438   GFRAA >89 12/15/2011 1350   Lipase  No results found for: LIPASE     Studies/Results: Ct Head Wo Contrast  Result Date: 04/23/2018 CLINICAL DATA:  63 year old male with a history of fall from a ladder EXAM: CT HEAD WITHOUT CONTRAST CT CERVICAL SPINE WITHOUT CONTRAST TECHNIQUE: Multidetector CT imaging of the head and cervical spine was performed following the standard protocol without intravenous contrast. Multiplanar CT image reconstructions of the cervical spine were also generated. COMPARISON:  None. FINDINGS: CT HEAD FINDINGS Brain: No acute intracranial hemorrhage. No midline shift or mass effect. Gray-white differentiation maintained. Unremarkable appearance of the ventricular system. Vascular: Vascular Skull: No acute fracture.  No aggressive bone lesion identified. Sinuses/Orbits: Unremarkable appearance of the orbits. Mastoid air cells clear. No middle ear effusion. No significant sinus disease. Other: None CT CERVICAL SPINE FINDINGS Alignment: Craniocervical junction aligned. Anatomic alignment of the cervical elements. No subluxation. Skull base and vertebrae: No acute fracture at the skullbase. Vertebral body heights relatively maintained. No acute fracture identified. Soft tissues and spinal canal: Subcutaneous/myofacial gas of the right right neck and right chest extending from the inferior aspects of the exam along the musculature nearly  to the skull base. No canal hematoma. No adenopathy. No radiopaque metallic foreign body Disc levels:  Disc space narrowing throughout the cervical spine. C2-C3: Mild disc disease without significant canal narrowing or foraminal narrowing. C3-C4: Mild disc disease and uncovertebral joint disease without significant foraminal narrowing or canal narrowing. C4-C5: No significant canal narrowing or foraminal narrowing with mild disc disease. C5-C6: Disc osteophyte complex with posterior disc bulge/protrusion, eccentric to  the left appearing to contact the ventral thecal sac. Associated uncovertebral joint disease bilaterally with facet disease contributing to bilateral foraminal narrowing. C6-C7: Posterior disc osteophyte complex with uncovertebral joint disease contributing to mild bilateral foraminal narrowing. No significant bony canal narrowing. C7-T1: No significant canal narrowing or foraminal narrowing. Upper chest: Tiny right apical pneumothorax. Fractures of the posterior right second and third ribs at the articulation. Displaced anterior right first rib fracture. Myofacial/subcutaneous gas at the right chest and right neck. Mixed interstitial and airspace disease at the right lung apex. No left pneumothorax. Other: Mild carotid calcifications. IMPRESSION: Head CT: Negative head CT for acute intracranial abnormality. Cervical CT: No acute fracture or malalignment of the cervical spine. Multilevel degenerative changes, as above. Worst degree of disc disease present at C5-C6 where there is posterior disc bulge/protrusion, eccentric to the left that may contact the ventral thecal sac. **An incidental finding of potential clinical significance has been found. Small right pneumothorax, better characterized on contemporaneous chest CT with associated right first, second, third rib fractures and subcutaneous/myofacial emphysema.** These results were called by telephone at the time of interpretation on 04/23/2018 at 3:33 pm to Dr. Blane OharaJOSHUA ZAVITZ. Electronically Signed   By: Gilmer MorJaime  Wagner D.O.   On: 04/23/2018 15:34   Ct Chest W Contrast  Result Date: 04/23/2018 CLINICAL DATA:  Chest and abdominal pain after fall off ladder. EXAM: CT CHEST, ABDOMEN, AND PELVIS WITH CONTRAST TECHNIQUE: Multidetector CT imaging of the chest, abdomen and pelvis was performed following the standard protocol during bolus administration of intravenous contrast. CONTRAST:  100mL OMNIPAQUE IOHEXOL 300 MG/ML  SOLN COMPARISON:  CT scan of Jul 29, 2014.  FINDINGS: CT CHEST FINDINGS Cardiovascular: Atherosclerosis of thoracic aorta is noted without aneurysm or dissection. Normal cardiac size. No pericardial effusion. Mediastinum/Nodes: No enlarged mediastinal, hilar, or axillary lymph nodes. Thyroid gland, trachea, and esophagus demonstrate no significant findings. Lungs/Pleura: Mild right pneumothorax is noted anteriorly. Mild left basilar subsegmental atelectasis is noted. Probable moderate size right hemothorax is noted with adjacent atelectasis of the right upper and lower lobes. Musculoskeletal: Large amount of subcutaneous emphysema is seen in the right supraclavicular and chest wall regions. Mildly displaced fractures are seen involving the right second, third, fourth, fifth, 6, seventh, eighth, and ninth ribs. CT ABDOMEN PELVIS FINDINGS Hepatobiliary: No gallstones are noted. Nodular hepatic contours are noted concerning for possible hepatic cirrhosis. No biliary dilatation is noted. No focal hepatic lesion is noted. Pancreas: Unremarkable. No pancreatic ductal dilatation or surrounding inflammatory changes. Spleen: Normal in size without focal abnormality. Adrenals/Urinary Tract: Adrenal glands are unremarkable. Kidneys are normal, without renal calculi, focal lesion, or hydronephrosis. Bladder is unremarkable. Stomach/Bowel: Stomach is within normal limits. Appendix appears normal. No evidence of bowel wall thickening, distention, or inflammatory changes. Vascular/Lymphatic: Aortic atherosclerosis. No enlarged abdominal or pelvic lymph nodes. Collateral veins are seen in the left upper quadrant suggesting portal hypertension. Reproductive: Prostate is unremarkable. Other: No abdominal wall hernia or abnormality. No abdominopelvic ascites. Musculoskeletal: No acute or significant osseous findings. IMPRESSION: Mild right anterior pneumothorax is noted with moderate size right  hemothorax. Adjacent subsegmental atelectasis of right upper and lower lobes are  noted. Multiple mildly displaced right rib fractures are noted. Large amount of subcutaneous emphysema is seen over right lateral chest wall and supraclavicular region. Critical Value/emergent results were called by telephone at the time of interpretation on 04/23/2018 at 3:37 pm to Dr. Emelda BrothersKahut , who verbally acknowledged these results. Findings consistent with hepatic cirrhosis with enlarged collateral veins suggesting portal hypertension. No traumatic injury is noted in the abdomen or pelvis. Aortic Atherosclerosis (ICD10-I70.0). Electronically Signed   By: Lupita RaiderJames  Green Jr, M.D.   On: 04/23/2018 15:37   Ct Cervical Spine Wo Contrast  Result Date: 04/23/2018 CLINICAL DATA:  63 year old male with a history of fall from a ladder EXAM: CT HEAD WITHOUT CONTRAST CT CERVICAL SPINE WITHOUT CONTRAST TECHNIQUE: Multidetector CT imaging of the head and cervical spine was performed following the standard protocol without intravenous contrast. Multiplanar CT image reconstructions of the cervical spine were also generated. COMPARISON:  None. FINDINGS: CT HEAD FINDINGS Brain: No acute intracranial hemorrhage. No midline shift or mass effect. Gray-white differentiation maintained. Unremarkable appearance of the ventricular system. Vascular: Vascular Skull: No acute fracture.  No aggressive bone lesion identified. Sinuses/Orbits: Unremarkable appearance of the orbits. Mastoid air cells clear. No middle ear effusion. No significant sinus disease. Other: None CT CERVICAL SPINE FINDINGS Alignment: Craniocervical junction aligned. Anatomic alignment of the cervical elements. No subluxation. Skull base and vertebrae: No acute fracture at the skullbase. Vertebral body heights relatively maintained. No acute fracture identified. Soft tissues and spinal canal: Subcutaneous/myofacial gas of the right right neck and right chest extending from the inferior aspects of the exam along the musculature nearly to the skull base. No canal  hematoma. No adenopathy. No radiopaque metallic foreign body Disc levels:  Disc space narrowing throughout the cervical spine. C2-C3: Mild disc disease without significant canal narrowing or foraminal narrowing. C3-C4: Mild disc disease and uncovertebral joint disease without significant foraminal narrowing or canal narrowing. C4-C5: No significant canal narrowing or foraminal narrowing with mild disc disease. C5-C6: Disc osteophyte complex with posterior disc bulge/protrusion, eccentric to the left appearing to contact the ventral thecal sac. Associated uncovertebral joint disease bilaterally with facet disease contributing to bilateral foraminal narrowing. C6-C7: Posterior disc osteophyte complex with uncovertebral joint disease contributing to mild bilateral foraminal narrowing. No significant bony canal narrowing. C7-T1: No significant canal narrowing or foraminal narrowing. Upper chest: Tiny right apical pneumothorax. Fractures of the posterior right second and third ribs at the articulation. Displaced anterior right first rib fracture. Myofacial/subcutaneous gas at the right chest and right neck. Mixed interstitial and airspace disease at the right lung apex. No left pneumothorax. Other: Mild carotid calcifications. IMPRESSION: Head CT: Negative head CT for acute intracranial abnormality. Cervical CT: No acute fracture or malalignment of the cervical spine. Multilevel degenerative changes, as above. Worst degree of disc disease present at C5-C6 where there is posterior disc bulge/protrusion, eccentric to the left that may contact the ventral thecal sac. **An incidental finding of potential clinical significance has been found. Small right pneumothorax, better characterized on contemporaneous chest CT with associated right first, second, third rib fractures and subcutaneous/myofacial emphysema.** These results were called by telephone at the time of interpretation on 04/23/2018 at 3:33 pm to Dr. Blane OharaJOSHUA ZAVITZ.  Electronically Signed   By: Gilmer MorJaime  Wagner D.O.   On: 04/23/2018 15:34   Ct Abdomen Pelvis W Contrast  Result Date: 04/23/2018 CLINICAL DATA:  Chest and abdominal pain after fall off ladder. EXAM: CT  CHEST, ABDOMEN, AND PELVIS WITH CONTRAST TECHNIQUE: Multidetector CT imaging of the chest, abdomen and pelvis was performed following the standard protocol during bolus administration of intravenous contrast. CONTRAST:  OMNIPAQUE IOHEXOL 300 MG/ML  SOLN COMPARISON:  CT scan of Jul 29, 2014. FINDINGS: CT CHEST FINDINGS Cardiovascular: Atherosclerosis of thoracic aorta is noted without aneurysm or dissection. Normal cardiac size. No pericardial effusion. Mediastinum/Nodes: No enlarged mediastinal, hilar, or axillary lymph nodes. Thyroid gland, trachea, and esophagus demonstrate no significant findings. Lungs/Pleura: Mild right pneumothorax is noted anteriorly. Mild left basilar subsegmental atelectasis is noted. Probable moderate size right hemothorax is noted with adjacent atelectasis of the right upper and lower lobes. Musculoskeletal: Large amount of subcutaneous emphysema is seen in the right supraclavicular and chest wall regions. Mildly displaced fractures are seen involving the right second, third, fourth, fifth, 6, seventh, eighth, and ninth ribs. CT ABDOMEN PELVIS FINDINGS Hepatobiliary: No gallstones are noted. Nodular hepatic contours are noted concerning for possible hepatic cirrhosis. No biliary dilatation is noted. No focal hepatic lesion is noted. Pancreas: Unremarkable. No pancreatic ductal dilatation or surrounding inflammatory changes. Spleen: Normal in size without focal abnormality. Adrenals/Urinary Tract: Adrenal glands are unremarkable. Kidneys are normal, without renal calculi, focal lesion, or hydronephrosis. Bladder is unremarkable. Stomach/Bowel: Stomach is within normal limits. Appendix appears normal. No evidence of bowel wall thickening, distention, or inflammatory changes.  Vascular/Lymphatic: Aortic atherosclerosis. No enlarged abdominal or pelvic lymph nodes. Collateral veins are seen in the left upper quadrant suggesting portal hypertension. Reproductive: Prostate is unremarkable. Other: No abdominal wall hernia or abnormality. No abdominopelvic ascites. Musculoskeletal: No acute or significant osseous findings. IMPRESSION: Mild right anterior pneumothorax is noted with moderate size right hemothorax. Adjacent subsegmental atelectasis of right upper and lower lobes are noted. Multiple mildly displaced right rib fractures are noted. Large amount of subcutaneous emphysema is seen over right lateral chest wall and supraclavicular region. Critical Value/emergent results were called by telephone at the time of interpretation on 04/23/2018 at 3:37 pm to Dr. Emelda Brothers , who verbally acknowledged these results. Findings consistent with hepatic cirrhosis with enlarged collateral veins suggesting portal hypertension. No traumatic injury is noted in the abdomen or pelvis. Aortic Atherosclerosis (ICD10-I70.0). Electronically Signed   By: Lupita Raider, M.D.   On: 04/23/2018 15:37   Dg Pelvis Portable  Result Date: 04/23/2018 CLINICAL DATA:  Fall from tree. EXAM: PORTABLE PELVIS 1-2 VIEWS COMPARISON:  None. FINDINGS: There is no evidence of pelvic fracture or diastasis. No pelvic bone lesions are seen. IMPRESSION: Negative. Electronically Signed   By: Lupita Raider, M.D.   On: 04/23/2018 14:23   Dg Chest Port 1 View  Result Date: 04/24/2018 CLINICAL DATA:  Hemopneumothorax EXAM: PORTABLE CHEST 1 VIEW COMPARISON:  04/23/2018 FINDINGS: Stable right chest tube. No pneumothorax. Emphysema in the right supraclavicular and right axillary regions is stable. Lungs are under aerated with bibasilar atelectasis. No pleural effusion. Right rib fractures are again noted. IMPRESSION: Stable right chest tube without pneumothorax. Electronically Signed   By: Jolaine Click M.D.   On: 04/24/2018 07:57    Dg Chest Port 1 View  Result Date: 04/23/2018 CLINICAL DATA:  Right pneumothorax and multiple right rib fractures secondary to a fall from a tree today. Chest tube insertion. EXAM: PORTABLE CHEST 1 VIEW 4:16 p.m. COMPARISON:  Chest x-rays dated 04/23/2018 2:08 p.m. and 02/25/2016 and chest CT dated 04/23/2018 FINDINGS: Right-sided chest tube has been inserted. No visible right pneumothorax. Subcutaneous emphysema in the right supraclavicular region and right lateral  chest wall is essentially unchanged. There are multiple right lateral rib fractures. In addition, review of the prior chest CT demonstrates nondisplaced fractures of the right transverse processes of T3 through T11. Haziness superior medially on the right and at the inferior aspect of the right hilum extending into the right lung base probably represent a combination of atelectasis with possible lung contusion. Minimal atelectasis at the left lung base laterally. Heart size and vascularity are normal. IMPRESSION: 1. Right chest tube in place with no visible residual pneumothorax. 2. Hazy areas of atelectasis or lung contusion in the right lung as described. 3. Stable subcutaneous emphysema on the right. 4. Multiple right lateral rib fractures. 5. Review of the recent CT scan also demonstrates multiple right thoracic transverse process fractures. Electronically Signed   By: Francene Boyers M.D.   On: 04/23/2018 16:44   Dg Chest Port 1 View  Result Date: 04/23/2018 CLINICAL DATA:  Trauma, fall from tree. EXAM: PORTABLE CHEST 1 VIEW COMPARISON:  Chest radiograph February 25, 2016 FINDINGS: Acute appearing RIGHT third through ninth rib fractures. Patchy RIGHT lower lung zone airspace opacity. Mild chronic interstitial changes without pleural effusion. No pneumothorax. Cardiomediastinal silhouette is normal. Calcified aortic arch. RIGHT chest wall subcutaneous gas tracking into neck. IMPRESSION: 1. Acute RIGHT third through ninth rib fractures. No  identified pneumothorax though CT would be more sensitive. RIGHT chest wall subcutaneous emphysema. 2. Increasing constant acuity of RIGHT lung base opacity, possible contusion. 3. Acute findings discussed with and reconfirmed by Dr.JOSHUA ZAVITZ on 04/23/2018 at 2:26 pm. Electronically Signed   By: Awilda Metro M.D.   On: 04/23/2018 14:27    Anti-infectives: Anti-infectives (From admission, onward)   None       Assessment/Plan Fall from tree Right rib FX 2-9 - pain control, IS, PT/OT ordered, ambulate with assist Right hemopneumothorax - CT in place with 800cc out since placement.  hgb down from 16 to 14.  Repeat CXR in am Hep C/Cirrhosis - hasn't been on lasix or aldactone in 6 years.  INR is 1.3, will hold chemical prophylaxis for now secondary to cirrhosis and hemothorax. FEN - regular diet/SLIV, increase oxy to 15-20mg  q 4hrs, and other mutlimodal pain control VTE - SCDs/hold chemical prophylaxis secondary to cirrhosis and hemothorax ID - none needed, CBC 23K, will follow.  No evidence of infection, likely secondary to trauma   LOS: 1 day    Letha Cape , [facility] 04/24/2018, 8:46 AM Pager: [phone]

## 2018-04-24 NOTE — Progress Notes (Signed)
Throughout the night the pt was continually in quite a bit of pain, never dropping below a 6/10 even with all available pain medicine being given.  Pt normally takes 15mg  oxycodone @ home Q4H.  Will relay to dayshift the need to possibly switch/ increase the pain medicine he is receiving in order to better control his pain.

## 2018-04-25 ENCOUNTER — Inpatient Hospital Stay (HOSPITAL_COMMUNITY): Payer: Medicare HMO

## 2018-04-25 LAB — CBC
HCT: 36.8 % — ABNORMAL LOW (ref 39.0–52.0)
Hemoglobin: 13.1 g/dL (ref 13.0–17.0)
MCH: 32.4 pg (ref 26.0–34.0)
MCHC: 35.6 g/dL (ref 30.0–36.0)
MCV: 91.1 fL (ref 80.0–100.0)
Platelets: 188 10*3/uL (ref 150–400)
RBC: 4.04 MIL/uL — AB (ref 4.22–5.81)
RDW: 13.5 % (ref 11.5–15.5)
WBC: 18.6 10*3/uL — ABNORMAL HIGH (ref 4.0–10.5)
nRBC: 0 % (ref 0.0–0.2)

## 2018-04-25 MED ORDER — ENOXAPARIN SODIUM 40 MG/0.4ML ~~LOC~~ SOLN
40.0000 mg | SUBCUTANEOUS | Status: DC
  Administered 2018-04-25 – 2018-05-03 (×9): 40 mg via SUBCUTANEOUS
  Filled 2018-04-25 (×10): qty 0.4

## 2018-04-25 MED ORDER — HYDROMORPHONE HCL 1 MG/ML IJ SOLN: 0.5000 mg | INTRAMUSCULAR | Status: DC | PRN

## 2018-04-25 MED ORDER — GABAPENTIN 600 MG PO TABS
300.0000 mg | ORAL_TABLET | Freq: Three times a day (TID) | ORAL | Status: DC
  Administered 2018-04-25 – 2018-04-29 (×15): 300 mg via ORAL
  Filled 2018-04-25 (×15): qty 1

## 2018-04-25 NOTE — Progress Notes (Addendum)
Occupational Therapy Treatment Patient Details Name: Francisco MallowClifton Patin MRN: [id number] DOB: 04/21/1955 Today's Date: 04/25/2018    History of present illness 63 yo admitted after fall from tree with Rt rib fx 2-9 with HPTX. PMhx: hep C and cirrhosis   OT comments  Patient progressing slowly.  Able to complete basic transfers with min guard assist, but requires increased time and effort for all activities.  Discussed clothing modifications for LB ADLs, ex: slip on shoes, as patient requires total assist to don socks.  Greatly limited by pain, but improved R UE AROM today. Educated on importance of oxygen post accident, as patient has decreased awareness to deficits.  Further assessment of cognition is recommended. Continue to recommend 24/7 assistance.     Follow Up Recommendations  Home health OT;Supervision/Assistance - 24 hour    Equipment Recommendations  3 in 1 bedside commode    Recommendations for Other Services      Precautions / Restrictions Precautions Precautions: Fall Precaution Comments: chest tube Restrictions Weight Bearing Restrictions: No       Mobility Bed Mobility Overal bed mobility: Needs Assistance Bed Mobility: Supine to Sit     Supine to sit: Min guard     General bed mobility comments: cueing for technique, patient determined to complete without assist but increased time and effort   Transfers Overall transfer level: Needs assistance Equipment used: Rolling walker (2 wheeled) Transfers: Sit to/from Stand Sit to Stand: Min guard         General transfer comment: min guard for safety and stability     Balance Overall balance assessment: Needs assistance Sitting-balance support: No upper extremity supported;Feet supported Sitting balance-Leahy Scale: Fair     Standing balance support: During functional activity;Bilateral upper extremity supported Standing balance-Leahy Scale: Fair Standing balance comment: reliant on UE support                            ADL either performed or assessed with clinical judgement   ADL Overall ADL's : Needs assistance/impaired     Grooming: Minimal assistance;Sitting               Lower Body Dressing: Maximal assistance;Sit to/from stand Lower Body Dressing Details (indicate cue type and reason): patient unable to don socks, reviewed compensatory strategies but will benefit from AE training  Toilet Transfer: Min guard;Ambulation;BSC;RW           Functional mobility during ADLs: Min guard;Rolling walker;Cueing for safety General ADL Comments: pt guarded throughout session, pain limiting tolerance     Vision   Vision Assessment?: No apparent visual deficits   Perception     Praxis      Cognition Arousal/Alertness: Awake/alert Behavior During Therapy: WFL for tasks assessed/performed;Anxious Overall Cognitive Status: Impaired/Different from baseline Area of Impairment: Safety/judgement;Awareness;Problem solving                         Safety/Judgement: Decreased awareness of safety;Decreased awareness of deficits Awareness: Emergent Problem Solving: Requires verbal cues General Comments: decreased awareness to deficits, reporting "I dont need the oxgyen, I didnt need it at home"         Exercises Exercises: Other exercises Other Exercises Other Exercises: shoulder flexion and foward press x 10 reps 1 set using R UE    Shoulder Instructions       General Comments pt on 2L supplemental oxgyen via [place] during session with saturations maintained <91%  Pertinent Vitals/ Pain       Pain Assessment: Faces Faces Pain Scale: Hurts even more Pain Location: right chest Pain Descriptors / Indicators: Aching;Discomfort;Constant Pain Intervention(s): Limited activity within patient's tolerance;Premedicated before session;Repositioned  Home Living                                          Prior Functioning/Environment               Frequency  Min 2X/week        Progress Toward Goals  OT Goals(current goals can now be found in the care plan section)  Progress towards OT goals: Progressing toward goals  Acute Rehab OT Goals Patient Stated Goal: return home OT Goal Formulation: With patient Time For Goal Achievement: 05/08/18 Potential to Achieve Goals: Good  Plan Discharge plan remains appropriate;Frequency remains appropriate    Co-evaluation                 AM-PAC OT "6 Clicks" Daily Activity     Outcome Measure   Help from another person eating meals?: A Little Help from another person taking care of personal grooming?: A Little Help from another person toileting, which includes using toliet, bedpan, or urinal?: A Lot Help from another person bathing (including washing, rinsing, drying)?: A Lot Help from another person to put on and taking off regular upper body clothing?: A Lot Help from another person to put on and taking off regular lower body clothing?: A Lot 6 Click Score: 14    End of Session Equipment Utilized During Treatment: Rolling walker;Oxygen  OT Visit Diagnosis: Other abnormalities of gait and mobility (R26.89);Pain;Muscle weakness (generalized) (M62.81) Pain - Right/Left: Right Pain - part of body: (ribs)   Activity Tolerance Patient limited by pain   Patient Left in chair;with call bell/phone within reach   Nurse Communication Mobility status;Other (comment)(pain)        Time: 0240-9735 OT Time Calculation (min): 33 min  Charges: OT General Charges $OT Visit: 1 Visit OT Treatments $Self Care/Home Management : 23-37 mins  Chancy Milroy, OT Acute Rehabilitation Services Pager [phone] Office [phone]    Chancy Milroy 04/25/2018, 1:16 PM

## 2018-04-25 NOTE — Care Management Note (Signed)
Case Management Note  Patient Details  Name: Lucas MallowClifton Daw MRN: [id number] Date of Birth: 10/15/1955  Subjective/Objective:   63 yo admitted after fall from tree with Rt rib fx 2-9 with HPTX.  PTA, pt independent, lives alone.                   Action/Plan: PT/OT recommending HH follow up, and pt agreeable to therapies.  He states family members and a neighbor will be able to assist at dc.  Will follow to arrange HH/DME prior to dc.  Expected Discharge Date:                  Expected Discharge Plan:  Home w Home Health Services  In-House Referral:     Discharge planning Services  CM Consult  Post Acute Care Choice:  Home Health Choice offered to:  Patient  DME Arranged:    DME Agency:     HH Arranged:    HH Agency:     Status of Service:  In process, will continue to follow  If discussed at Long Length of Stay Meetings, dates discussed:    Additional Comments:  Quintella BatonJulie W. Jerell Demery, RN, BSN  Trauma/Neuro ICU Case Manager [phone]

## 2018-04-25 NOTE — Progress Notes (Signed)
[facility] Progress Note     Subjective: CC: Pain Patient with pain in R chest with movement or coughing or deep breaths. Using IS, pulling 1250. Tolerating diet. Takes oxy at home, has taken gabapentin in the past and states at a higher/more frequent dose it made him feel euphoric. He is willing to try gabapentin here. Lives at home alone but has a neighbor and daughter that could check on him some.   Objective: Vital signs in last 24 hours: Temp:  [98.1 F (36.7 C)-98.9 F (37.2 C)] 98.5 F (36.9 C) (01/29 0400) Pulse Rate:  [91-122] 91 (01/28 2300) Resp:  [17-22] 17 (01/28 2300) BP: (139-159)/(83-93) 157/83 (01/29 0400) SpO2:  [91 %-96 %] 96 % (01/28 2300) Last BM Date: 04/23/18  Intake/Output from previous day: 01/28 0701 - 01/29 0700 In: -  Out: 990 [Urine:800; Chest Tube:190] Intake/Output this shift: No intake/output data recorded.  PE: Gen:  Alert, NAD, pleasant Card:  Sinus tachycardia in low 100s Pulm:  Normal effort, slightly decreased in R base, CT without air leak, sanguinous drainage, 95% on 3L  Abd: Soft, non-tender, non-distended, bowel sounds present  Skin: warm and dry, no rashes  Psych: A&Ox3   Lab Results:  Recent Labs    04/24/18 0438 04/25/18 0520  WBC 23.6* 18.6*  HGB 14.8 13.1  HCT 42.7 36.8*  PLT 222 188   BMET Recent Labs    04/24/18 0438 04/24/18 1008  NA 137 136  K 4.9 4.3  CL 104 106  CO2 21* 21*  GLUCOSE 110* 124*  BUN 11 12  CREATININE 0.67 0.91  CALCIUM 8.4* 8.6*   PT/INR Recent Labs    04/23/18 1459  LABPROT 16.0*  INR 1.30   CMP     Component Value Date/Time   NA 136 04/24/2018 1008   K 4.3 04/24/2018 1008   CL 106 04/24/2018 1008   CO2 21 (L) 04/24/2018 1008   GLUCOSE 124 (H) 04/24/2018 1008   BUN 12 04/24/2018 1008   CREATININE 0.91 04/24/2018 1008   CREATININE 0.68 12/15/2011 1350   CALCIUM 8.6 (L) 04/24/2018 1008   PROT 6.5 04/23/2018 1459   ALBUMIN 3.4 (L) 04/23/2018 1459   AST 96 (H)  04/23/2018 1459   ALT 49 (H) 04/23/2018 1459   ALKPHOS 76 04/23/2018 1459   BILITOT 1.1 04/23/2018 1459   GFRNONAA >60 04/24/2018 1008   GFRNONAA >89 12/15/2011 1350   GFRAA >60 04/24/2018 1008   GFRAA >89 12/15/2011 1350   Lipase  No results found for: LIPASE     Studies/Results: Ct Head Wo Contrast  Result Date: 04/23/2018 CLINICAL DATA:  63 year old male with a history of fall from a ladder EXAM: CT HEAD WITHOUT CONTRAST CT CERVICAL SPINE WITHOUT CONTRAST TECHNIQUE: Multidetector CT imaging of the head and cervical spine was performed following the standard protocol without intravenous contrast. Multiplanar CT image reconstructions of the cervical spine were also generated. COMPARISON:  None. FINDINGS: CT HEAD FINDINGS Brain: No acute intracranial hemorrhage. No midline shift or mass effect. Gray-white differentiation maintained. Unremarkable appearance of the ventricular system. Vascular: Vascular Skull: No acute fracture.  No aggressive bone lesion identified. Sinuses/Orbits: Unremarkable appearance of the orbits. Mastoid air cells clear. No middle ear effusion. No significant sinus disease. Other: None CT CERVICAL SPINE FINDINGS Alignment: Craniocervical junction aligned. Anatomic alignment of the cervical elements. No subluxation. Skull base and vertebrae: No acute fracture at the skullbase. Vertebral body heights relatively maintained. No acute fracture identified. Soft tissues and spinal  canal: Subcutaneous/myofacial gas of the right right neck and right chest extending from the inferior aspects of the exam along the musculature nearly to the skull base. No canal hematoma. No adenopathy. No radiopaque metallic foreign body Disc levels:  Disc space narrowing throughout the cervical spine. C2-C3: Mild disc disease without significant canal narrowing or foraminal narrowing. C3-C4: Mild disc disease and uncovertebral joint disease without significant foraminal narrowing or canal narrowing.  C4-C5: No significant canal narrowing or foraminal narrowing with mild disc disease. C5-C6: Disc osteophyte complex with posterior disc bulge/protrusion, eccentric to the left appearing to contact the ventral thecal sac. Associated uncovertebral joint disease bilaterally with facet disease contributing to bilateral foraminal narrowing. C6-C7: Posterior disc osteophyte complex with uncovertebral joint disease contributing to mild bilateral foraminal narrowing. No significant bony canal narrowing. C7-T1: No significant canal narrowing or foraminal narrowing. Upper chest: Tiny right apical pneumothorax. Fractures of the posterior right second and third ribs at the articulation. Displaced anterior right first rib fracture. Myofacial/subcutaneous gas at the right chest and right neck. Mixed interstitial and airspace disease at the right lung apex. No left pneumothorax. Other: Mild carotid calcifications. IMPRESSION: Head CT: Negative head CT for acute intracranial abnormality. Cervical CT: No acute fracture or malalignment of the cervical spine. Multilevel degenerative changes, as above. Worst degree of disc disease present at C5-C6 where there is posterior disc bulge/protrusion, eccentric to the left that may contact the ventral thecal sac. **An incidental finding of potential clinical significance has been found. Small right pneumothorax, better characterized on contemporaneous chest CT with associated right first, second, third rib fractures and subcutaneous/myofacial emphysema.** These results were called by telephone at the time of interpretation on 04/23/2018 at 3:33 pm to Dr. Blane Ohara. Electronically Signed   By: Gilmer Mor D.O.   On: 04/23/2018 15:34   Ct Chest W Contrast  Result Date: 04/23/2018 CLINICAL DATA:  Chest and abdominal pain after fall off ladder. EXAM: CT CHEST, ABDOMEN, AND PELVIS WITH CONTRAST TECHNIQUE: Multidetector CT imaging of the chest, abdomen and pelvis was performed following  the standard protocol during bolus administration of intravenous contrast. CONTRAST:  OMNIPAQUE IOHEXOL 300 MG/ML  SOLN COMPARISON:  CT scan of Jul 29, 2014. FINDINGS: CT CHEST FINDINGS Cardiovascular: Atherosclerosis of thoracic aorta is noted without aneurysm or dissection. Normal cardiac size. No pericardial effusion. Mediastinum/Nodes: No enlarged mediastinal, hilar, or axillary lymph nodes. Thyroid gland, trachea, and esophagus demonstrate no significant findings. Lungs/Pleura: Mild right pneumothorax is noted anteriorly. Mild left basilar subsegmental atelectasis is noted. Probable moderate size right hemothorax is noted with adjacent atelectasis of the right upper and lower lobes. Musculoskeletal: Large amount of subcutaneous emphysema is seen in the right supraclavicular and chest wall regions. Mildly displaced fractures are seen involving the right second, third, fourth, fifth, 6, seventh, eighth, and ninth ribs. CT ABDOMEN PELVIS FINDINGS Hepatobiliary: No gallstones are noted. Nodular hepatic contours are noted concerning for possible hepatic cirrhosis. No biliary dilatation is noted. No focal hepatic lesion is noted. Pancreas: Unremarkable. No pancreatic ductal dilatation or surrounding inflammatory changes. Spleen: Normal in size without focal abnormality. Adrenals/Urinary Tract: Adrenal glands are unremarkable. Kidneys are normal, without renal calculi, focal lesion, or hydronephrosis. Bladder is unremarkable. Stomach/Bowel: Stomach is within normal limits. Appendix appears normal. No evidence of bowel wall thickening, distention, or inflammatory changes. Vascular/Lymphatic: Aortic atherosclerosis. No enlarged abdominal or pelvic lymph nodes. Collateral veins are seen in the left upper quadrant suggesting portal hypertension. Reproductive: Prostate is unremarkable. Other: No abdominal wall hernia  or abnormality. No abdominopelvic ascites. Musculoskeletal: No acute or significant osseous findings.  IMPRESSION: Mild right anterior pneumothorax is noted with moderate size right hemothorax. Adjacent subsegmental atelectasis of right upper and lower lobes are noted. Multiple mildly displaced right rib fractures are noted. Large amount of subcutaneous emphysema is seen over right lateral chest wall and supraclavicular region. Critical Value/emergent results were called by telephone at the time of interpretation on 04/23/2018 at 3:37 pm to Dr. Emelda BrothersKahut , who verbally acknowledged these results. Findings consistent with hepatic cirrhosis with enlarged collateral veins suggesting portal hypertension. No traumatic injury is noted in the abdomen or pelvis. Aortic Atherosclerosis (ICD10-I70.0). Electronically Signed   By: Lupita RaiderJames  Green Jr, M.D.   On: 04/23/2018 15:37   Ct Cervical Spine Wo Contrast  Result Date: 04/23/2018 CLINICAL DATA:  63 year old male with a history of fall from a ladder EXAM: CT HEAD WITHOUT CONTRAST CT CERVICAL SPINE WITHOUT CONTRAST TECHNIQUE: Multidetector CT imaging of the head and cervical spine was performed following the standard protocol without intravenous contrast. Multiplanar CT image reconstructions of the cervical spine were also generated. COMPARISON:  None. FINDINGS: CT HEAD FINDINGS Brain: No acute intracranial hemorrhage. No midline shift or mass effect. Gray-white differentiation maintained. Unremarkable appearance of the ventricular system. Vascular: Vascular Skull: No acute fracture.  No aggressive bone lesion identified. Sinuses/Orbits: Unremarkable appearance of the orbits. Mastoid air cells clear. No middle ear effusion. No significant sinus disease. Other: None CT CERVICAL SPINE FINDINGS Alignment: Craniocervical junction aligned. Anatomic alignment of the cervical elements. No subluxation. Skull base and vertebrae: No acute fracture at the skullbase. Vertebral body heights relatively maintained. No acute fracture identified. Soft tissues and spinal canal:  Subcutaneous/myofacial gas of the right right neck and right chest extending from the inferior aspects of the exam along the musculature nearly to the skull base. No canal hematoma. No adenopathy. No radiopaque metallic foreign body Disc levels:  Disc space narrowing throughout the cervical spine. C2-C3: Mild disc disease without significant canal narrowing or foraminal narrowing. C3-C4: Mild disc disease and uncovertebral joint disease without significant foraminal narrowing or canal narrowing. C4-C5: No significant canal narrowing or foraminal narrowing with mild disc disease. C5-C6: Disc osteophyte complex with posterior disc bulge/protrusion, eccentric to the left appearing to contact the ventral thecal sac. Associated uncovertebral joint disease bilaterally with facet disease contributing to bilateral foraminal narrowing. C6-C7: Posterior disc osteophyte complex with uncovertebral joint disease contributing to mild bilateral foraminal narrowing. No significant bony canal narrowing. C7-T1: No significant canal narrowing or foraminal narrowing. Upper chest: Tiny right apical pneumothorax. Fractures of the posterior right second and third ribs at the articulation. Displaced anterior right first rib fracture. Myofacial/subcutaneous gas at the right chest and right neck. Mixed interstitial and airspace disease at the right lung apex. No left pneumothorax. Other: Mild carotid calcifications. IMPRESSION: Head CT: Negative head CT for acute intracranial abnormality. Cervical CT: No acute fracture or malalignment of the cervical spine. Multilevel degenerative changes, as above. Worst degree of disc disease present at C5-C6 where there is posterior disc bulge/protrusion, eccentric to the left that may contact the ventral thecal sac. **An incidental finding of potential clinical significance has been found. Small right pneumothorax, better characterized on contemporaneous chest CT with associated right first, second,  third rib fractures and subcutaneous/myofacial emphysema.** These results were called by telephone at the time of interpretation on 04/23/2018 at 3:33 pm to Dr. Blane OharaJOSHUA ZAVITZ. Electronically Signed   By: Gilmer MorJaime  Wagner D.O.   On: 04/23/2018 15:34  Ct Abdomen Pelvis W Contrast  Result Date: 04/23/2018 CLINICAL DATA:  Chest and abdominal pain after fall off ladder. EXAM: CT CHEST, ABDOMEN, AND PELVIS WITH CONTRAST TECHNIQUE: Multidetector CT imaging of the chest, abdomen and pelvis was performed following the standard protocol during bolus administration of intravenous contrast. CONTRAST:  100mL OMNIPAQUE IOHEXOL 300 MG/ML  SOLN COMPARISON:  CT scan of Jul 29, 2014. FINDINGS: CT CHEST FINDINGS Cardiovascular: Atherosclerosis of thoracic aorta is noted without aneurysm or dissection. Normal cardiac size. No pericardial effusion. Mediastinum/Nodes: No enlarged mediastinal, hilar, or axillary lymph nodes. Thyroid gland, trachea, and esophagus demonstrate no significant findings. Lungs/Pleura: Mild right pneumothorax is noted anteriorly. Mild left basilar subsegmental atelectasis is noted. Probable moderate size right hemothorax is noted with adjacent atelectasis of the right upper and lower lobes. Musculoskeletal: Large amount of subcutaneous emphysema is seen in the right supraclavicular and chest wall regions. Mildly displaced fractures are seen involving the right second, third, fourth, fifth, 6, seventh, eighth, and ninth ribs. CT ABDOMEN PELVIS FINDINGS Hepatobiliary: No gallstones are noted. Nodular hepatic contours are noted concerning for possible hepatic cirrhosis. No biliary dilatation is noted. No focal hepatic lesion is noted. Pancreas: Unremarkable. No pancreatic ductal dilatation or surrounding inflammatory changes. Spleen: Normal in size without focal abnormality. Adrenals/Urinary Tract: Adrenal glands are unremarkable. Kidneys are normal, without renal calculi, focal lesion, or hydronephrosis. Bladder  is unremarkable. Stomach/Bowel: Stomach is within normal limits. Appendix appears normal. No evidence of bowel wall thickening, distention, or inflammatory changes. Vascular/Lymphatic: Aortic atherosclerosis. No enlarged abdominal or pelvic lymph nodes. Collateral veins are seen in the left upper quadrant suggesting portal hypertension. Reproductive: Prostate is unremarkable. Other: No abdominal wall hernia or abnormality. No abdominopelvic ascites. Musculoskeletal: No acute or significant osseous findings. IMPRESSION: Mild right anterior pneumothorax is noted with moderate size right hemothorax. Adjacent subsegmental atelectasis of right upper and lower lobes are noted. Multiple mildly displaced right rib fractures are noted. Large amount of subcutaneous emphysema is seen over right lateral chest wall and supraclavicular region. Critical Value/emergent results were called by telephone at the time of interpretation on 04/23/2018 at 3:37 pm to Dr. Emelda BrothersKahut , who verbally acknowledged these results. Findings consistent with hepatic cirrhosis with enlarged collateral veins suggesting portal hypertension. No traumatic injury is noted in the abdomen or pelvis. Aortic Atherosclerosis (ICD10-I70.0). Electronically Signed   By: Lupita RaiderJames  Green Jr, M.D.   On: 04/23/2018 15:37   Dg Pelvis Portable  Result Date: 04/23/2018 CLINICAL DATA:  Fall from tree. EXAM: PORTABLE PELVIS 1-2 VIEWS COMPARISON:  None. FINDINGS: There is no evidence of pelvic fracture or diastasis. No pelvic bone lesions are seen. IMPRESSION: Negative. Electronically Signed   By: Lupita RaiderJames  Green Jr, M.D.   On: 04/23/2018 14:23   Dg Chest Port 1 View  Result Date: 04/25/2018 CLINICAL DATA:  Rib fractures.  Fall.  Hemo pneumothorax. EXAM: PORTABLE CHEST 1 VIEW COMPARISON:  04/24/2018. FINDINGS: RIGHT pigtail catheter good position. No visible pneumothorax. Unchanged RIGHT-sided rib fractures. Stable subcutaneous emphysema. IMPRESSION: Stable RIGHT chest tube  without pneumothorax. Electronically Signed   By: Elsie StainJohn T Curnes M.D.   On: 04/25/2018 06:37   Dg Chest Port 1 View  Result Date: 04/24/2018 CLINICAL DATA:  Hemopneumothorax EXAM: PORTABLE CHEST 1 VIEW COMPARISON:  04/23/2018 FINDINGS: Stable right chest tube. No pneumothorax. Emphysema in the right supraclavicular and right axillary regions is stable. Lungs are under aerated with bibasilar atelectasis. No pleural effusion. Right rib fractures are again noted. IMPRESSION: Stable right chest tube without pneumothorax. Electronically  Signed   By: Jolaine Click M.D.   On: 04/24/2018 07:57   Dg Chest Port 1 View  Result Date: 04/23/2018 CLINICAL DATA:  Right pneumothorax and multiple right rib fractures secondary to a fall from a tree today. Chest tube insertion. EXAM: PORTABLE CHEST 1 VIEW 4:16 p.m. COMPARISON:  Chest x-rays dated 04/23/2018 2:08 p.m. and 02/25/2016 and chest CT dated 04/23/2018 FINDINGS: Right-sided chest tube has been inserted. No visible right pneumothorax. Subcutaneous emphysema in the right supraclavicular region and right lateral chest wall is essentially unchanged. There are multiple right lateral rib fractures. In addition, review of the prior chest CT demonstrates nondisplaced fractures of the right transverse processes of T3 through T11. Haziness superior medially on the right and at the inferior aspect of the right hilum extending into the right lung base probably represent a combination of atelectasis with possible lung contusion. Minimal atelectasis at the left lung base laterally. Heart size and vascularity are normal. IMPRESSION: 1. Right chest tube in place with no visible residual pneumothorax. 2. Hazy areas of atelectasis or lung contusion in the right lung as described. 3. Stable subcutaneous emphysema on the right. 4. Multiple right lateral rib fractures. 5. Review of the recent CT scan also demonstrates multiple right thoracic transverse process fractures. Electronically  Signed   By: Francene Boyers M.D.   On: 04/23/2018 16:44   Dg Chest Port 1 View  Result Date: 04/23/2018 CLINICAL DATA:  Trauma, fall from tree. EXAM: PORTABLE CHEST 1 VIEW COMPARISON:  Chest radiograph February 25, 2016 FINDINGS: Acute appearing RIGHT third through ninth rib fractures. Patchy RIGHT lower lung zone airspace opacity. Mild chronic interstitial changes without pleural effusion. No pneumothorax. Cardiomediastinal silhouette is normal. Calcified aortic arch. RIGHT chest wall subcutaneous gas tracking into neck. IMPRESSION: 1. Acute RIGHT third through ninth rib fractures. No identified pneumothorax though CT would be more sensitive. RIGHT chest wall subcutaneous emphysema. 2. Increasing constant acuity of RIGHT lung base opacity, possible contusion. 3. Acute findings discussed with and reconfirmed by Dr.JOSHUA ZAVITZ on 04/23/2018 at 2:26 pm. Electronically Signed   By: Awilda Metro M.D.   On: 04/23/2018 14:27    Anti-infectives: Anti-infectives (From admission, onward)   None       Assessment/Plan Fall from tree Right rib FX 2-9- pain control, IS, PT/OT ordered, ambulate with assist Right hemopneumothorax- CT in place with 350cc out 24h.  hgb down from 16 to 13. - CXR without ptx, will put to [facility] today - repeat CXR in AM Hep C/Cirrhosis- hasn't been on lasix or aldactone in 6 years. INR is 1.3, will hold chemical prophylaxis for now secondary to cirrhosis and hemothorax.  FEN -regular diet/SLIV, prn oxy 15-20mg  q 4hrs, added gabapentin TID VTE -SCDs/hold chemical prophylaxis secondary to cirrhosis and hemothorax ID- none needed, WBC 18K from 23K.  No evidence of infection, likely secondary to trauma  Dispo: CT to [facility]. PT/OT recommending HH therapies. Wean O2 to room air.   LOS: 2 days    Wells Guiles , [facility] 04/25/2018, 8:21 AM Pager: [phone]

## 2018-04-26 ENCOUNTER — Encounter (HOSPITAL_COMMUNITY): Payer: Self-pay | Admitting: *Deleted

## 2018-04-26 ENCOUNTER — Inpatient Hospital Stay (HOSPITAL_COMMUNITY): Payer: Medicare HMO

## 2018-04-26 LAB — COMPREHENSIVE METABOLIC PANEL
ALT: 32 U/L (ref 0–44)
AST: 41 U/L (ref 15–41)
Albumin: 3.1 g/dL — ABNORMAL LOW (ref 3.5–5.0)
Alkaline Phosphatase: 56 U/L (ref 38–126)
Anion gap: 9 (ref 5–15)
BUN: 12 mg/dL (ref 8–23)
CO2: 25 mmol/L (ref 22–32)
Calcium: 8.3 mg/dL — ABNORMAL LOW (ref 8.9–10.3)
Chloride: 101 mmol/L (ref 98–111)
Creatinine, Ser: 0.75 mg/dL (ref 0.61–1.24)
GFR calc non Af Amer: >60 mL/min (ref >60)
Glucose, Bld: 118 mg/dL — ABNORMAL HIGH (ref 70–99)
Potassium: 4.2 mmol/L (ref 3.5–5.1)
Sodium: 135 mmol/L (ref 135–145)
Total Bilirubin: 1.7 mg/dL — ABNORMAL HIGH (ref 0.3–1.2)
Total Protein: 6.7 g/dL (ref 6.5–8.1)

## 2018-04-26 LAB — CBC
HCT: 38.6 % — ABNORMAL LOW (ref 39.0–52.0)
Hemoglobin: 13.4 g/dL (ref 13.0–17.0)
MCH: 31.8 pg (ref 26.0–34.0)
MCHC: 34.7 g/dL (ref 30.0–36.0)
MCV: 91.5 fL (ref 80.0–100.0)
PLATELETS: 257 10*3/uL (ref 150–400)
RBC: 4.22 MIL/uL (ref 4.22–5.81)
RDW: 13.5 % (ref 11.5–15.5)
WBC: 19.9 10*3/uL — ABNORMAL HIGH (ref 4.0–10.5)
nRBC: 0 % (ref 0.0–0.2)

## 2018-04-26 MED ORDER — GLYCERIN (LAXATIVE) 2.1 G RE SUPP
1.0000 | Freq: Once | RECTAL | Status: AC
  Administered 2018-04-26: 1 via RECTAL
  Filled 2018-04-26: qty 1

## 2018-04-26 NOTE — Discharge Instructions (Signed)
DO NOT remove occlusive dressing on right chest for 48-72 hours after chest tube removal. After that you may remove the dressing and shower.   Hemothorax  Hemothorax is a buildup of blood in the space between the lungs and the chest wall (pleural cavity). This can cause trouble breathing, a collapsed lung (pneumothorax), and other dangerous problems. This condition is a medical emergency that must be treated right away in a hospital. What are the causes? This condition is most often caused by an injury (trauma) that causes a tear in a lung or in a blood vessel in the chest. Other possible causes include:  Tuberculosis.  An injury caused by placing a tube into a blood vessel in the chest (central venous catheter).  Cancer in the chest.  A problem with how your blood clots.  Blood thinner (anticoagulant) medicines.  Lung surgery or heart surgery. What are the signs or symptoms? Signs and symptoms may include:  Rapid breathing.  Difficulty breathing.  Shortness of breath.  Feeling light-headed.  Anxiety.  Restlessness.  A rapid heart rate.  Low blood pressure (hypotension).  Chest pain.  Skin that is cool, sweaty, pale, or blue. How is this diagnosed? This condition may be diagnosed based on:  Your symptoms and medical history. Your health care provider may ask about any recent injuries you have had.  A physical exam.  A chest X-ray.  Removal and testing of a blood sample from the pleural cavity. How is this treated? This condition must be treated at the hospital. Treatment may include:  A procedure to place a small tube into the pleural cavity (chest tube). The chest tube drains fluid, blood, and extra air. It can also be used to expand a pneumothorax, if needed.  Surgery to open the chest and control bleeding (thoracotomy). You may need surgery if bleeding continues after you have a chest tube placed.  IV fluids.  Blood transfusion. You may receive: ? Your  own blood that is collected from a chest drainage device and infused back into your body (autotransfusion). ? Blood from a donor (blood transfusion). Follow these instructions at home: Medicines  Take over-the-counter and prescription medicines only as told by your health care provider.  Do not drive or use heavy machinery while taking prescription pain medicine. General instructions  Return to your normal activities as told by your health care provider. Ask your health care provider what activities are safe for you.  If a cough or pain makes it difficult for you to sleep at night, try sleeping in a semi-upright position in a recliner or by using 2 or 3 pillows.  If you were given an incentive spirometer, use it every 1-2 hours while you are awake or as recommended by your health care provider. This device measures how well you are filling your lungs with each breath.  If you had a chest tube and it was removed, ask your health care provider when you can remove the bandage (dressing). While the dressing is in place, do not allow it to get wet.  Keep all follow-up visits as told by your health care provider. This is important.  Do not use any products that contain nicotine or tobacco, such as cigarettes and e-cigarettes. If you need help quitting, ask your health care provider. Contact a health care provider if:  Your pain is not controlled with the medicines you were prescribed.  You were treated with a chest tube, and you have redness, increasing pain, or discharge at  the site where it was placed.  You have a fever. Get help right away if:  You have difficulty breathing.  You begin coughing up blood.  You have chest pain. These symptoms may represent a serious problem that is an emergency. Do not wait to see if the symptoms will go away. Get medical help right away. Call your local emergency services (911 in the [place] Do not drive yourself to the hospital. Summary  Hemothorax  is a buildup of blood in the space between the lungs and the chest wall (pleural cavity).  This condition is a medical emergency that must be treated in a hospital right away.  Hemothorax is most often caused by an injury (trauma) that causes a tear in a lung or in a blood vessel in the chest. Other possible causes include tuberculosis, cancer, blood thinner (anticoagulant) medicines, lung or heart surgery, or a problem with how your blood clots.  Treatment includes receiving donated blood (blood transfusion) and having a small tube placed in the pleural cavity (chest tube). The tube can help drain blood, fluid, and extra air. It can also be used to expand a collapsed lung (pneumothorax).  In some cases, surgery is needed to stop the bleeding. This information is not intended to replace advice given to you by your health care provider. Make sure you discuss any questions you have with your health care provider. Document Released: 12/09/2003 Document Revised: 04/10/2017 Document Reviewed: 04/10/2017 Elsevier Interactive Patient Education  [employer]2019 [employer] Inc[employer]        Thank you for participating in our program to help patients manage their pain after surgery without opioids. This is part of our effort to provide you with the best care possible, without exposing you or your family to the risk that opioids pose.  What pain can I expect after surgery? You can expect to have some pain after surgery. This is normal. The pain is typically worse the day after surgery, and quickly begins to get better. Many studies have found that many patients are able to manage their pain after surgery with Over-the-Counter (OTC) medications such as Tylenol and Motrin. If you have a condition that does not allow you to take Tylenol or Motrin, notify your surgical team.  How will I manage my pain? The best strategy for controlling your pain after surgery is around the clock pain control with Tylenol  (acetaminophen) and Motrin (ibuprofen or Advil). Alternating these medications with each other allows you to maximize your pain control. In addition to Tylenol and Motrin, you can use heating pads or ice packs on your incisions to help reduce your pain.  How will I alternate your regular strength over-the-counter pain medication? You will take a dose of pain medication every three hours. ; Start by taking 650 mg of Tylenol (2 pills of 325 mg) ; 3 hours later take 600 mg of Motrin (3 pills of 200 mg) ; 3 hours after taking the Motrin take 650 mg of Tylenol ; 3 hours after that take 600 mg of Motrin.   - 1 -  See example - if your first dose of Tylenol is at 12:00 PM   12:00 PM Tylenol 650 mg (2 pills of 325 mg)  3:00 PM Motrin 600 mg (3 pills of 200 mg)  6:00 PM Tylenol 650 mg (2 pills of 325 mg)  9:00 PM Motrin 600 mg (3 pills of 200 mg)  Continue alternating every 3 hours   We recommend that you follow this  schedule around-the-clock for at least 3 days after surgery, or until you feel that it is no longer needed. Use the table on the last page of this handout to keep track of the medications you are taking. Important: Do not take more than 3000mg  of Tylenol or 3200mg  of Motrin in a 24-hour period. Do not take ibuprofen/Motrin if you have a history of bleeding stomach ulcers, severe kidney disease, &/or actively taking a blood thinner  What if I still have pain? If you have pain that is not controlled with the over-the-counter pain medications (Tylenol and Motrin or Advil) you might have what we call breakthrough pain. You will receive a prescription for a small amount of an opioid pain medication such as Oxycodone, Tramadol, or Tylenol with Codeine. Use these opioid pills in the first 24 hours after surgery if you have breakthrough pain. Do not take more than 1 pill every 4-6 hours.  If you still have uncontrolled pain after using all opioid pills, don't hesitate to call our staff  using the number provided. We will help make sure you are managing your pain in the best way possible, and if necessary, we can provide a prescription for additional pain medication.   Day 1    Time  Name of Medication Number of pills taken  Amount of Acetaminophen  Pain Level   Comments  AM PM       AM PM       AM PM       AM PM       AM PM       AM PM       AM PM       AM PM       Total Daily amount of Acetaminophen Do not take more than  3,000 mg per day      Day 2    Time  Name of Medication Number of pills taken  Amount of Acetaminophen  Pain Level   Comments  AM PM       AM PM       AM PM       AM PM       AM PM       AM PM       AM PM       AM PM       Total Daily amount of Acetaminophen Do not take more than  3,000 mg per day      Day 3    Time  Name of Medication Number of pills taken  Amount of Acetaminophen  Pain Level   Comments  AM PM       AM PM       AM PM       AM PM          AM PM       AM PM       AM PM       AM PM       Total Daily amount of Acetaminophen Do not take more than  3,000 mg per day      Day 4    Time  Name of Medication Number of pills taken  Amount of Acetaminophen  Pain Level   Comments  AM PM       AM PM       AM PM       AM PM       AM PM  AM PM       AM PM       AM PM       Total Daily amount of Acetaminophen Do not take more than  3,000 mg per day      Day 5    Time  Name of Medication Number of pills taken  Amount of Acetaminophen  Pain Level   Comments  AM PM       AM PM       AM PM       AM PM       AM PM       AM PM       AM PM       AM PM       Total Daily amount of Acetaminophen Do not take more than  3,000 mg per day       Day 6    Time  Name of Medication Number of pills taken  Amount of Acetaminophen  Pain Level  Comments  AM PM       AM PM       AM PM       AM PM       AM PM       AM PM       AM PM       AM PM       Total Daily amount of  Acetaminophen Do not take more than  3,000 mg per day      Day 7    Time  Name of Medication Number of pills taken  Amount of Acetaminophen  Pain Level   Comments  AM PM       AM PM       AM PM       AM PM       AM PM       AM PM       AM PM       AM PM       Total Daily amount of Acetaminophen Do not take more than  3,000 mg per day        For additional information about how and where to safely dispose of unused opioid medications - [employer]https://www.morepowerfulnc.org  Disclaimer: This document contains information and/or instructional materials adapted from [facility] for the typical patient with your condition. It does not replace medical advice from your health care provider because your experience may differ from that of the typical patient. Talk to your health care provider if you have any questions about this document, your condition or your treatment plan. Adapted from [facility]

## 2018-04-26 NOTE — Progress Notes (Signed)
Physical Therapy Treatment Patient Details Name: Francisco Floyd MRN: [id number] DOB: Nov 21, 1955 Today's Date: 04/26/2018    History of Present Illness 63 yo admitted after fall from tree with Rt rib fx 2-9 with HPTX. PMhx: hep C and cirrhosis    PT Comments    Pt received in bed, motivated to participate in therapy. Pt able to perform bed mobility with use of rails min guard assist. Min guard assist also required for transfers and ambulation 280 feet with RW. Pt is making good progress with mobility. PT to continue per POC.    Follow Up Recommendations  Home health PT;Supervision - Intermittent     Equipment Recommendations  3in1 (PT)    Recommendations for Other Services       Precautions / Restrictions Precautions Precautions: Fall;Other (comment) Precaution Comments: chest tube     Mobility  Bed Mobility Overal bed mobility: Needs Assistance Bed Mobility: Supine to Sit;Rolling Rolling: Modified independent (Device/Increase time)   Supine to sit: Min guard     General bed mobility comments: increased time and effort, +rail  Transfers Overall transfer level: Needs assistance Equipment used: Rolling walker (2 wheeled) Transfers: Sit to/from Stand Sit to Stand: Min guard         General transfer comment: min guard for safety and stability   Ambulation/Gait Ambulation/Gait assistance: Min guard Gait Distance (Feet): 280 Feet Assistive device: Rolling walker (2 wheeled) Gait Pattern/deviations: Step-through pattern;Decreased stride length Gait velocity: mildly decreased Gait velocity interpretation: >2.62 ft/sec, indicative of community ambulatory General Gait Details: Pt ambulated on RA with SpO2 91%. Steady gait with RW.   Stairs             Wheelchair Mobility    Modified Rankin (Stroke Patients Only)       Balance Overall balance assessment: Needs assistance Sitting-balance support: No upper extremity supported;Feet supported Sitting  balance-Leahy Scale: Fair Sitting balance - Comments: statically, limited dynamically due to pain    Standing balance support: During functional activity;Bilateral upper extremity supported Standing balance-Leahy Scale: Fair Standing balance comment: reliant on UE support                            Cognition Arousal/Alertness: Awake/alert Behavior During Therapy: WFL for tasks assessed/performed;Anxious Overall Cognitive Status: Within Functional Limits for tasks assessed                                        Exercises      General Comments General comments (skin integrity, edema, etc.): SpO2 91% on RA during session.      Pertinent Vitals/Pain Pain Assessment: 0-10 Pain Score: 6  Pain Location: R flank during mobility Pain Descriptors / Indicators: Sore;Grimacing Pain Intervention(s): Monitored during session;Repositioned    Home Living                      Prior Function            PT Goals (current goals can now be found in the care plan section) Acute Rehab PT Goals Patient Stated Goal: return home PT Goal Formulation: With patient Time For Goal Achievement: 05/08/18 Potential to Achieve Goals: Good Progress towards PT goals: Progressing toward goals    Frequency    Min 3X/week      PT Plan Current plan remains appropriate    Co-evaluation  AM-PAC PT "6 Clicks" Mobility   Outcome Measure  Help needed turning from your back to your side while in a flat bed without using bedrails?: A Little Help needed moving from lying on your back to sitting on the side of a flat bed without using bedrails?: A Little Help needed moving to and from a bed to a chair (including a wheelchair)?: A Little Help needed standing up from a chair using your arms (e.g., wheelchair or bedside chair)?: A Little Help needed to walk in hospital room?: A Little Help needed climbing 3-5 steps with a railing? : A Little 6 Click  Score: 18    End of Session Equipment Utilized During Treatment: Gait belt Activity Tolerance: Patient tolerated treatment well Patient left: in chair;with call bell/phone within reach Nurse Communication: Mobility status PT Visit Diagnosis: Other abnormalities of gait and mobility (R26.89);Difficulty in walking, not elsewhere classified (R26.2)     Time: [id number] PT Time Calculation (min) (ACUTE ONLY): 24 min  Charges:  $Gait Training: 23-37 mins                     Aida RaiderWendy Larson [place], [place]PT  Office # [phone] Pager [phone]    Ilda FoilGarrow, Gilberto Stanforth Rene 04/26/2018, 9:35 AM

## 2018-04-26 NOTE — Progress Notes (Signed)
Patient ID: Francisco Floyd, male   DOB: 1955-07-29, 63 y.o.   MRN: [id number]       Subjective: Patient feels well this morning.  Wants to get up and walk.  Hasn't had a BM since admission.  Otherwise doing well.  Weaned O2 off yesterday, replaced over night.  Objective: Vital signs in last 24 hours: Temp:  [97.7 F (36.5 C)-99.2 F (37.3 C)] 98.3 F (36.8 C) (01/30 0502) Pulse Rate:  [80-104] 80 (01/30 0502) Resp:  [16-21] 21 (01/30 0502) BP: (136-151)/(77-88) 137/77 (01/30 0502) SpO2:  [93 %-98 %] 98 % (01/30 0502) Last BM Date: 04/23/18  Intake/Output from previous day: 01/29 0701 - 01/30 0700 In: 490 [P.O.:490] Out: 1010 [Urine:950; Chest Tube:60] Intake/Output this shift: No intake/output data recorded.  PE: Gen: NAD Heart: regular, around 100 Lungs: CTAB, CT with 200cc of serosang output in last 24hrs, no air leak.  CT site is c/d/i Abd: soft, NT, ND Ext: MAE, NVI  Lab Results:  Recent Labs    04/25/18 0520 04/26/18 0329  WBC 18.6* 19.9*  HGB 13.1 13.4  HCT 36.8* 38.6*  PLT 188 257   BMET Recent Labs    04/24/18 1008 04/26/18 0329  NA 136 135  K 4.3 4.2  CL 106 101  CO2 21* 25  GLUCOSE 124* 118*  BUN 12 12  CREATININE 0.91 0.75  CALCIUM 8.6* 8.3*   PT/INR Recent Labs    04/23/18 1459  LABPROT 16.0*  INR 1.30   CMP     Component Value Date/Time   NA 135 04/26/2018 0329   K 4.2 04/26/2018 0329   CL 101 04/26/2018 0329   CO2 25 04/26/2018 0329   GLUCOSE 118 (H) 04/26/2018 0329   BUN 12 04/26/2018 0329   CREATININE 0.75 04/26/2018 0329   CREATININE 0.68 12/15/2011 1350   CALCIUM 8.3 (L) 04/26/2018 0329   PROT 6.7 04/26/2018 0329   ALBUMIN 3.1 (L) 04/26/2018 0329   AST 41 04/26/2018 0329   ALT 32 04/26/2018 0329   ALKPHOS 56 04/26/2018 0329   BILITOT 1.7 (H) 04/26/2018 0329   GFRNONAA >60 04/26/2018 0329   GFRNONAA >89 12/15/2011 1350   GFRAA >60 04/26/2018 0329   GFRAA >89 12/15/2011 1350   Lipase  No results found for:  LIPASE     Studies/Results: Dg Chest Port 1 View  Result Date: 04/25/2018 CLINICAL DATA:  Rib fractures.  Fall.  Hemo pneumothorax. EXAM: PORTABLE CHEST 1 VIEW COMPARISON:  04/24/2018. FINDINGS: RIGHT pigtail catheter good position. No visible pneumothorax. Unchanged RIGHT-sided rib fractures. Stable subcutaneous emphysema. IMPRESSION: Stable RIGHT chest tube without pneumothorax. Electronically Signed   By: Elsie Stain M.D.   On: 04/25/2018 06:37    Anti-infectives: Anti-infectives (From admission, onward)   None       Assessment/Plan Fall from tree Right rib FX 2-9- pain control, IS, PT/OT, ambulate with assist Right hemopneumothorax- CT in place with200cc out 24h. hgb stable at 13. - CXR pending but appears stable, continue on waterseal until output under 100cc Hep C/Cirrhosis- hasn't been on lasix or aldactone in 6 years. INR is 1.3  FEN -regular diet/SLIV, prn oxy 15-20mg  q 4hrs, gabapentin TID VTE -SCDs/Lovenox ID- none needed, WBC 19K, unclear etiology, but no evidence of infectious source  Dispo: CT to [facility]. PT/OT recommending HH therapies. Wean O2 to room air.    LOS: 3 days    Letha Cape , [facility] 04/26/2018, 7:56 AM Pager: [phone]

## 2018-04-27 ENCOUNTER — Inpatient Hospital Stay (HOSPITAL_COMMUNITY): Payer: Medicare HMO

## 2018-04-27 LAB — CBC
HEMATOCRIT: 32.7 % — AB (ref 39.0–52.0)
HEMOGLOBIN: 11.8 g/dL — AB (ref 13.0–17.0)
MCH: 32.5 pg (ref 26.0–34.0)
MCHC: 36.1 g/dL — ABNORMAL HIGH (ref 30.0–36.0)
MCV: 90.1 fL (ref 80.0–100.0)
Platelets: 240 10*3/uL (ref 150–400)
RBC: 3.63 MIL/uL — ABNORMAL LOW (ref 4.22–5.81)
RDW: 13.4 % (ref 11.5–15.5)
WBC: 16.1 10*3/uL — ABNORMAL HIGH (ref 4.0–10.5)
nRBC: 0 % (ref 0.0–0.2)

## 2018-04-27 MED ORDER — BISACODYL 10 MG RE SUPP
10.0000 mg | Freq: Once | RECTAL | Status: AC
  Administered 2018-04-27: 10 mg via RECTAL
  Filled 2018-04-27: qty 1

## 2018-04-27 MED ORDER — SENNOSIDES-DOCUSATE SODIUM 8.6-50 MG PO TABS
1.0000 | ORAL_TABLET | Freq: Two times a day (BID) | ORAL | Status: DC
  Administered 2018-04-27 – 2018-05-04 (×15): 1 via ORAL
  Filled 2018-04-27 (×15): qty 1

## 2018-04-27 MED ORDER — HYDROMORPHONE HCL 1 MG/ML IJ SOLN: 0.5000 mg | Freq: Four times a day (QID) | INTRAMUSCULAR | Status: DC | PRN

## 2018-04-27 NOTE — Care Management Important Message (Signed)
Important Message  Patient Details  Name: Francisco Floyd MRN: [id number] Date of Birth: [birth year]   Medicare Important Message Given:  Yes    Dorena Bodo 04/27/2018, 3:24 PM

## 2018-04-27 NOTE — Progress Notes (Signed)
[facility] Progress Note     Subjective: CC: constipation Feels full and is passing flatus but has not been able to have BM. Reports pain in R chest with getting up but overall reports pain is improving. Has been working to mobilize more. Pulling IS to 2000 and denies SOB.   Objective: Vital signs in last 24 hours: Temp:  [98.2 F (36.8 C)-100 F (37.8 C)] 98.2 F (36.8 C) (01/31 0427) Pulse Rate:  [89-107] 89 (01/31 0427) Resp:  [16-18] 18 (01/31 0427) BP: (127-155)/(78-85) 136/78 (01/31 0427) SpO2:  [91 %-100 %] 94 % (01/31 0427) Last BM Date: 04/23/18  Intake/Output from previous day: 01/30 0701 - 01/31 0700 In: 1140 [P.O.:1140] Out: 875 [Urine:475; Chest Tube:400] Intake/Output this shift: No intake/output data recorded.  PE: Gen:  Alert, NAD, pleasant Card:  RRR Pulm:  Normal effort, slightly decreased in R base, CT without air leak, sanguinous drainage, 94% on room air Abd: Soft, non-tender, non-distended, bowel sounds present  Skin: warm and dry, no rashes  Psych: A&Ox3   Lab Results:  Recent Labs    04/25/18 0520 04/26/18 0329  WBC 18.6* 19.9*  HGB 13.1 13.4  HCT 36.8* 38.6*  PLT 188 257   BMET Recent Labs    04/24/18 1008 04/26/18 0329  NA 136 135  K 4.3 4.2  CL 106 101  CO2 21* 25  GLUCOSE 124* 118*  BUN 12 12  CREATININE 0.91 0.75  CALCIUM 8.6* 8.3*   PT/INR No results for input(s): LABPROT, INR in the last 72 hours. CMP     Component Value Date/Time   NA 135 04/26/2018 0329   K 4.2 04/26/2018 0329   CL 101 04/26/2018 0329   CO2 25 04/26/2018 0329   GLUCOSE 118 (H) 04/26/2018 0329   BUN 12 04/26/2018 0329   CREATININE 0.75 04/26/2018 0329   CREATININE 0.68 12/15/2011 1350   CALCIUM 8.3 (L) 04/26/2018 0329   PROT 6.7 04/26/2018 0329   ALBUMIN 3.1 (L) 04/26/2018 0329   AST 41 04/26/2018 0329   ALT 32 04/26/2018 0329   ALKPHOS 56 04/26/2018 0329   BILITOT 1.7 (H) 04/26/2018 0329   GFRNONAA >60 04/26/2018 0329   GFRNONAA >89 12/15/2011 1350   GFRAA >60 04/26/2018 0329   GFRAA >89 12/15/2011 1350   Lipase  No results found for: LIPASE     Studies/Results: Dg Chest Port 1 View  Result Date: 04/26/2018 CLINICAL DATA:  Follow-up hemothorax EXAM: PORTABLE CHEST 1 VIEW COMPARISON:  04/25/2018 FINDINGS: Cardiac shadow is stable. Right apical chest tube is noted without evidence of pneumothorax. Multiple right-sided rib fractures are again noted. No focal infiltrate or sizable effusion is seen. IMPRESSION: No pneumothorax identified.  Chest tube remains in place. Multiple right-sided rib fractures are again seen Electronically Signed   By: Alcide Clever M.D.   On: 04/26/2018 08:15    Anti-infectives: Anti-infectives (From admission, onward)   None       Assessment/Plan Fall from tree Right rib FX 2-9- pain control, IS, PT/OT, ambulate with assist Right hemopneumothorax- CT in place with400cc out24h - CXR pending, continue on waterseal until output under 100cc Hep C/Cirrhosis- hasn't been on lasix or aldactone in 6 years. INR is 1.3  FEN -regular diet/SLIV,prnoxy 15-20mg  q 4hrs, gabapentin TID VTE -SCDs/Lovenox ID- none needed, [id number] yesterday recheck today, afeb  Dispo: CT to [facility]. PT/OT recommending HH therapies.CXR and CBC pending  LOS: 4 days    Wells Guiles , [facility] 04/27/2018, 7:22  AM Pager: [phone]

## 2018-04-27 NOTE — Progress Notes (Signed)
Physical Therapy Treatment Patient Details Name: Francisco MallowClifton Floyd MRN: [id number] DOB: 05/31/1955 Today's Date: 04/27/2018    History of Present Illness 63 yo admitted after fall from tree with Rt rib fx 2-9 with HPTX. PMhx: hep C and cirrhosis    PT Comments    Pt progressing well with mobility. Received on BSC having just finished taking a bath. He reports he has been up approx an hour this morning doing ADLs with NT assist. He required min guard assist sit to stand, supervision/min guard assist ambulation 350 feet with RW, and min guard assist sit to supine. Pt returned to bed at end of session due to pain and fatigue. Current POC remains appropriate.    Follow Up Recommendations  Home health PT;Supervision - Intermittent     Equipment Recommendations  3in1 (PT)    Recommendations for Other Services       Precautions / Restrictions Precautions Precautions: Fall;Other (comment) Precaution Comments: chest tube     Mobility  Bed Mobility Overal bed mobility: Needs Assistance Bed Mobility: Sit to Supine       Sit to supine: Min guard   General bed mobility comments: increased time and effort, +rail  Transfers Overall transfer level: Needs assistance Equipment used: Rolling walker (2 wheeled) Transfers: Sit to/from Stand Sit to Stand: Min guard         General transfer comment: min guard for safety and stability   Ambulation/Gait Ambulation/Gait assistance: Supervision;Min guard Gait Distance (Feet): 350 Feet Assistive device: Rolling walker (2 wheeled) Gait Pattern/deviations: Step-through pattern;Decreased stride length Gait velocity: WFL Gait velocity interpretation: >2.62 ft/sec, indicative of community ambulatory General Gait Details: Steady gait. SpO2 95% on RA   Stairs             Wheelchair Mobility    Modified Rankin (Stroke Patients Only)       Balance Overall balance assessment: Needs assistance Sitting-balance support: No upper  extremity supported;Feet supported Sitting balance-Leahy Scale: Good     Standing balance support: During functional activity;Bilateral upper extremity supported Standing balance-Leahy Scale: Fair Standing balance comment: reliant on UE support for dynamic balance                            Cognition Arousal/Alertness: Awake/alert Behavior During Therapy: WFL for tasks assessed/performed;Anxious Overall Cognitive Status: Within Functional Limits for tasks assessed                                        Exercises      General Comments        Pertinent Vitals/Pain Pain Assessment: 0-10 Pain Score: 8  Pain Location: R flank during mobility Pain Descriptors / Indicators: Sore;Grimacing Pain Intervention(s): Monitored during session;Repositioned    Home Living                      Prior Function            PT Goals (current goals can now be found in the care plan section) Acute Rehab PT Goals Patient Stated Goal: return home PT Goal Formulation: With patient Time For Goal Achievement: 05/08/18 Potential to Achieve Goals: Good Progress towards PT goals: Progressing toward goals    Frequency    Min 3X/week      PT Plan Current plan remains appropriate    Co-evaluation  AM-PAC PT "6 Clicks" Mobility   Outcome Measure  Help needed turning from your back to your side while in a flat bed without using bedrails?: None Help needed moving from lying on your back to sitting on the side of a flat bed without using bedrails?: A Little Help needed moving to and from a bed to a chair (including a wheelchair)?: A Little Help needed standing up from a chair using your arms (e.g., wheelchair or bedside chair)?: A Little Help needed to walk in hospital room?: A Little Help needed climbing 3-5 steps with a railing? : A Little 6 Click Score: 19    End of Session Equipment Utilized During Treatment: Gait belt Activity  Tolerance: Patient tolerated treatment well Patient left: in bed;with call bell/phone within reach Nurse Communication: Mobility status;Patient requests pain meds PT Visit Diagnosis: Other abnormalities of gait and mobility (R26.89);Difficulty in walking, not elsewhere classified (R26.2)     Time: 1050-1108 PT Time Calculation (min) (ACUTE ONLY): 18 min  Charges:  $Gait Training: 8-22 mins                     Aida Raider, PT  Office # [phone] Pager [phone]    Ilda Foil 04/27/2018, 11:24 AM

## 2018-04-27 NOTE — Progress Notes (Signed)
Occupational Therapy Treatment Patient Details Name: Francisco Floyd MRN: [id number] DOB: 05/02/1955 Today's Date: 04/27/2018    History of present illness 63 yo admitted after fall from tree with Rt rib fx 2-9 with HPTX. PMhx: hep C and cirrhosis   OT comments  Pt is a 63 yo male s/p above dx. Pt performing LB ADL sitting in bed with figure 4 technique for decreasing R sided rib pain. Pt sitting EOB to perform sit to stand with minguardA. Pt reveals increase pain with movement and wants to rest. Pt educated on available AE. Pt alert and oriented x4 today and does not appear to have any cognitive deficits- pt stating that he realizes that he was in the wrong for climbing the tree. Pt continues to require OT skilled services to increase progressing ADL and HEP in HHOT setting.    Follow Up Recommendations  Home health OT;Supervision - Intermittent    Equipment Recommendations  3 in 1 bedside commode    Recommendations for Other Services      Precautions / Restrictions Precautions Precautions: Fall Precaution Comments: chest tube        Mobility Bed Mobility Overal bed mobility: Needs Assistance Bed Mobility: Sit to Supine Rolling: Modified independent (Device/Increase time) Sidelying to sit: Min guard Supine to sit: Min guard Sit to supine: Min guard   General bed mobility comments: increased time and effort, +rail  Transfers Overall transfer level: Needs assistance Equipment used: Rolling walker (2 wheeled) Transfers: Sit to/from Stand Sit to Stand: Min guard         General transfer comment: min guard for safety and stability     Balance     Sitting balance-Leahy Scale: Good                                     ADL either performed or assessed with clinical judgement   ADL Overall ADL's : Needs assistance/impaired Eating/Feeding: Set up;Bed level               Upper Body Dressing : Min guard;Sitting   Lower Body Dressing:  Supervision/safety;Sitting/lateral leans;Bed level(figure 4 technique)                 General ADL Comments: Pt with increased pain for LB ADL, but pt does well with figure 4 technique.     Vision       Perception     Praxis      Cognition Arousal/Alertness: Awake/alert Behavior During Therapy: WFL for tasks assessed/performed;Anxious Overall Cognitive Status: Within Functional Limits for tasks assessed                                 General Comments: Pt's cognitive deficits have resolved        Exercises     Shoulder Instructions       General Comments Pt with increased pain and unwilling to perform functional mobility with RW so OTR reviewed exercises and chair push-ups for strengthening    Pertinent Vitals/ Pain       Pain Assessment: 0-10 Pain Score: 8  Pain Location: R flank during mobility Pain Descriptors / Indicators: Sore;Grimacing Pain Intervention(s): Limited activity within patient's tolerance  Home Living  Prior Functioning/Environment              Frequency  Min 2X/week        Progress Toward Goals  OT Goals(current goals can now be found in the care plan section)  Progress towards OT goals: Progressing toward goals  Acute Rehab OT Goals Patient Stated Goal: return home OT Goal Formulation: With patient Time For Goal Achievement: 05/08/18 Potential to Achieve Goals: Good ADL Goals Pt Will Perform Grooming: with modified independence;standing Pt Will Perform Upper Body Bathing: with set-up;sitting Pt Will Perform Lower Body Dressing: sit to/from stand;with supervision;with set-up;with adaptive equipment Pt Will Transfer to Toilet: with supervision;bedside commode;ambulating;regular height toilet Pt/caregiver will Perform Home Exercise Program: Right Upper extremity;Increased ROM;With written HEP provided  Plan Discharge plan remains appropriate;Frequency  remains appropriate    Co-evaluation                 AM-PAC OT "6 Clicks" Daily Activity     Outcome Measure   Help from another person eating meals?: A Little Help from another person taking care of personal grooming?: A Little Help from another person toileting, which includes using toliet, bedpan, or urinal?: A Little Help from another person bathing (including washing, rinsing, drying)?: A Little Help from another person to put on and taking off regular upper body clothing?: A Little Help from another person to put on and taking off regular lower body clothing?: A Little 6 Click Score: 18    End of Session Equipment Utilized During Treatment: Rolling walker  OT Visit Diagnosis: Other abnormalities of gait and mobility (R26.89);Pain;Muscle weakness (generalized) (M62.81) Pain - Right/Left: Right   Activity Tolerance Patient limited by pain   Patient Left in chair;with call bell/phone within reach   Nurse Communication Mobility status        Time: 0211-1735 OT Time Calculation (min): 22 min  Charges: OT General Charges $OT Visit: 1 Visit OT Treatments $Self Care/Home Management : 8-22 mins  Cristi Loron) Glendell Docker OTR/L Acute Rehabilitation Services Pager: [phone] Office: [phone]    Sandrea Hughs 04/27/2018, 4:10 PM

## 2018-04-27 NOTE — Plan of Care (Signed)
  Problem: Clinical Measurements: Goal: Ability to maintain clinical measurements within normal limits will improve Outcome: Progressing   Problem: Clinical Measurements: Goal: Respiratory complications will improve Outcome: Progressing   Problem: Activity: Goal: Risk for activity intolerance will decrease Outcome: Progressing   Problem: Pain Managment: Goal: General experience of comfort will improve Outcome: Progressing   

## 2018-04-27 NOTE — Care Management Note (Signed)
Case Management Note  Patient Details  Name: Francisco Floyd MRN: [id number] Date of Birth: 10/30/1955  Subjective/Objective:   63 yo admitted after fall from tree with Rt rib fx 2-9 with HPTX.  PTA, pt independent, lives alone.                   Action/Plan: PT/OT recommending HH follow up, and pt agreeable to therapies.  He states family members and a neighbor will be able to assist at dc.  Will follow to arrange HH/DME prior to dc.  Expected Discharge Date:                  Expected Discharge Plan:  Home w Home Health Services  In-House Referral:     Discharge planning Services  CM Consult  Post Acute Care Choice:  Home Health Choice offered to:  Patient  DME Arranged:    DME Agency:     HH Arranged:  PT, OT HH Agency:  [facility]  Status of Service:  In process, will continue to follow  If discussed at Long Length of Stay Meetings, dates discussed:    Additional Comments:  04/27/2018 J. Kadin Canipe, RN, BSN Pt still with chest tube; output too high to dc yet.  Referral to [facility] follow up at dc; start of care 24-48h post dc date.  Pt states he has needed DME at home, as his brother recently passed away.   Quintella Baton, RN, BSN  Trauma/Neuro ICU Case Manager [phone]

## 2018-04-28 ENCOUNTER — Inpatient Hospital Stay (HOSPITAL_COMMUNITY): Payer: Medicare HMO

## 2018-04-28 DIAGNOSIS — J90 Pleural effusion, not elsewhere classified: Secondary | ICD-10-CM

## 2018-04-28 HISTORY — DX: Pleural effusion, not elsewhere classified: J90

## 2018-04-28 NOTE — Progress Notes (Signed)
Patient ID: Francisco Floyd, male   DOB: [birth year], 63 y.o.   MRN: [id number] [facility] Surgery Progress Note:   * No surgery found *  Subjective: Mental status is clear;  No complaints Objective: Vital signs in last 24 hours: Temp:  [98.2 F (36.8 C)-99.1 F (37.3 C)] 99.1 F (37.3 C) (02/01 0445) Pulse Rate:  [88-94] 88 (02/01 0445) Resp:  [14-18] 16 (02/01 0445) BP: (136-155)/(73-84) 136/73 (02/01 0445) SpO2:  [93 %-96 %] 93 % (02/01 0445)  Intake/Output from previous day: 01/31 0701 - 02/01 0700 In: 300 [P.O.:300] Out: 1250 [Urine:800; Chest Tube:450] Intake/Output this shift: No intake/output data recorded.  Physical Exam: Work of breathing is not labored.  New CT apparatus  Lab Results:  Results for orders placed or performed during the hospital encounter of 04/23/18 (from the past 48 hour(s))  CBC     Status: Abnormal   Collection Time: 04/27/18  7:39 AM  Result Value Ref Range   WBC 16.1 (H) 4.0 - 10.5 K/uL   RBC 3.63 (L) 4.22 - 5.81 MIL/uL   Hemoglobin 11.8 (L) 13.0 - 17.0 g/dL   HCT 11.9 (L) 14.7 - 82.9 %   MCV 90.1 80.0 - 100.0 fL   MCH 32.5 26.0 - 34.0 pg   MCHC 36.1 (H) 30.0 - 36.0 g/dL   RDW 56.2 13.0 - 86.5 %   Platelets 240 150 - 400 K/uL   nRBC 0.0 0.0 - 0.2 %    Comment: Performed at [facility], [address] [place], [place] [address]    Radiology/Results: Dg Chest Port 1 View  Result Date: 04/27/2018 CLINICAL DATA:  Pneumothorax EXAM: PORTABLE CHEST 1 VIEW COMPARISON:  April 26, 2018 FINDINGS: The heart size and mediastinal contours are stable. Right apical chest tube is identified unchanged. There is no pneumothorax noted. Multiple right rib fractures are unchanged. Right subcutaneous emphysema is identified. IMPRESSION: Right apical chest tube is identified unchanged. There is no pneumothorax noted. Electronically Signed   By: Sherian Rein M.D.   On: 04/27/2018 10:37    Anti-infectives: Anti-infectives (From admission, onward)    None      Assessment/Plan: Problem List: Patient Active Problem List   Diagnosis Date Noted  . Fall 04/23/2018  . Multiple fractures of ribs, right side, initial encounter for closed fracture 04/23/2018  . Hemopneumothorax on right 04/23/2018  . Hepatitis C 04/23/2018  . Cirrhosis (HCC) 04/23/2018    CT with 450 out yesterday.  CXR this AM no pneumothorax; no fluid.  Continue chest tube to drainage until output is less than ~100 cc in 24 hrs.   * No surgery found *    LOS: 5 days   Matt B. Daphine Deutscher, MD, [facility] [phone] beeper [phone]  04/28/2018 8:11 AM

## 2018-04-29 ENCOUNTER — Inpatient Hospital Stay (HOSPITAL_COMMUNITY): Payer: Medicare HMO

## 2018-04-29 NOTE — Progress Notes (Signed)
Patient ID: Francisco Floyd, male   DOB: 10/16/1955, 63 y.o.   MRN: [id number] [facility] Surgery Progress Note:   * No surgery found *  Subjective: Mental status is clear.   Objective: Vital signs in last 24 hours: Temp:  [98.8 F (37.1 C)-100.3 F (37.9 C)] 100.3 F (37.9 C) (02/02 0543) Pulse Rate:  [94-101] 94 (02/02 0543) Resp:  [16-19] 19 (02/02 0543) BP: (144-151)/(72-90) 148/77 (02/02 0543) SpO2:  [93 %-95 %] 95 % (02/02 0543)  Intake/Output from previous day: 02/01 0701 - 02/02 0700 In: 1320 [P.O.:1320] Out: 2130 [Urine:1500; Chest Tube:630] Intake/Output this shift: No intake/output data recorded.  Physical Exam: Work of breathing is not labored;    Lab Results:  No results found for this or any previous visit (from the past 48 hour(s)).  Radiology/Results: Dg Chest Port 1 View  Result Date: 04/29/2018 CLINICAL DATA:  Chest tube in place. EXAM: PORTABLE CHEST 1 VIEW COMPARISON:  Radiograph of April 28, 2018. FINDINGS: The heart size and mediastinal contours are within normal limits. Right-sided chest tube is unchanged in position. No definite pneumothorax is noted. Stable right-sided rib fractures are noted with overlying subcutaneous emphysema. Mild bibasilar subsegmental atelectasis is noted. IMPRESSION: Stable position of right-sided chest tube without definite pneumothorax seen. Mild bibasilar subsegmental atelectasis is noted with overlying subcutaneous emphysema. Mild bibasilar subsegmental atelectasis. Electronically Signed   By: Lupita Raider, M.D.   On: 04/29/2018 07:50   Dg Chest Port 1 View  Result Date: 04/28/2018 CLINICAL DATA:  Right-sided rib fractures and hemothorax follow-up. EXAM: PORTABLE CHEST 1 VIEW COMPARISON:  Chest x-ray from yesterday. FINDINGS: Unchanged right apical pigtail chest tube. The heart size and mediastinal contours are within normal limits. Normal pulmonary vascularity. Unchanged right basilar atelectasis. No consolidation,  pneumothorax, or large pleural effusion. Unchanged right-sided rib fractures. Decreasing right chest wall subcutaneous emphysema. IMPRESSION: 1. Stable chest. Unchanged right apical chest tube and right basilar atelectasis. Electronically Signed   By: Obie Dredge M.D.   On: 04/28/2018 09:07   Dg Chest Port 1 View  Result Date: 04/27/2018 CLINICAL DATA:  Pneumothorax EXAM: PORTABLE CHEST 1 VIEW COMPARISON:  April 26, 2018 FINDINGS: The heart size and mediastinal contours are stable. Right apical chest tube is identified unchanged. There is no pneumothorax noted. Multiple right rib fractures are unchanged. Right subcutaneous emphysema is identified. IMPRESSION: Right apical chest tube is identified unchanged. There is no pneumothorax noted. Electronically Signed   By: Sherian Rein M.D.   On: 04/27/2018 10:37    Anti-infectives: Anti-infectives (From admission, onward)   None      Assessment/Plan: Problem List: Patient Active Problem List   Diagnosis Date Noted  . Fall 04/23/2018  . Multiple fractures of ribs, right side, initial encounter for closed fracture 04/23/2018  . Hemopneumothorax on right 04/23/2018  . Hepatitis C 04/23/2018  . Cirrhosis (HCC) 04/23/2018    Chest tube to water seal;  Has had 700 cc out since yesterday at this time.  Subcut air and air leak noted.  Continue chest tube.   * No surgery found *    LOS: 6 days   Matt B. Daphine Deutscher, MD, [facility] [phone] beeper [phone]  04/29/2018 8:42 AM

## 2018-04-29 NOTE — Plan of Care (Signed)
  Problem: Health Behavior/Discharge Planning: Goal: Ability to manage health-related needs will improve Outcome: Progressing   Problem: Clinical Measurements: Goal: Ability to maintain clinical measurements within normal limits will improve Outcome: Progressing Goal: Will remain free from infection Outcome: Progressing   Problem: Activity: Goal: Risk for activity intolerance will decrease Outcome: Progressing   Problem: Nutrition: Goal: Adequate nutrition will be maintained Outcome: Progressing   Problem: Coping: Goal: Level of anxiety will decrease Outcome: Progressing   Problem: Elimination: Goal: Will not experience complications related to bowel motility Outcome: Progressing Goal: Will not experience complications related to urinary retention Outcome: Progressing   Problem: Pain Managment: Goal: General experience of comfort will improve Outcome: Progressing   Problem: Safety: Goal: Ability to remain free from injury will improve Outcome: Progressing   Problem: Skin Integrity: Goal: Risk for impaired skin integrity will decrease Outcome: Progressing   

## 2018-04-30 ENCOUNTER — Inpatient Hospital Stay (HOSPITAL_COMMUNITY): Payer: Medicare HMO

## 2018-04-30 LAB — CBC
HCT: 31.1 % — ABNORMAL LOW (ref 39.0–52.0)
Hemoglobin: 11.1 g/dL — ABNORMAL LOW (ref 13.0–17.0)
MCH: 32.3 pg (ref 26.0–34.0)
MCHC: 35.7 g/dL (ref 30.0–36.0)
MCV: 90.4 fL (ref 80.0–100.0)
Platelets: 257 10*3/uL (ref 150–400)
RBC: 3.44 MIL/uL — ABNORMAL LOW (ref 4.22–5.81)
RDW: 13.9 % (ref 11.5–15.5)
WBC: 15.9 10*3/uL — ABNORMAL HIGH (ref 4.0–10.5)
nRBC: 0 % (ref 0.0–0.2)

## 2018-04-30 MED ORDER — GABAPENTIN 400 MG PO CAPS
400.0000 mg | ORAL_CAPSULE | Freq: Three times a day (TID) | ORAL | Status: DC
  Administered 2018-04-30 – 2018-05-02 (×9): 400 mg via ORAL
  Filled 2018-04-30 (×9): qty 1

## 2018-04-30 MED ORDER — METHOCARBAMOL 500 MG PO TABS
1000.0000 mg | ORAL_TABLET | Freq: Three times a day (TID) | ORAL | Status: DC
  Administered 2018-04-30 – 2018-05-03 (×10): 1000 mg via ORAL
  Filled 2018-04-30 (×10): qty 2

## 2018-04-30 MED ORDER — ACYCLOVIR 400 MG PO TABS
800.0000 mg | ORAL_TABLET | Freq: Every day | ORAL | Status: DC
  Administered 2018-04-30 – 2018-05-04 (×22): 800 mg via ORAL
  Filled 2018-04-30 (×24): qty 2

## 2018-04-30 NOTE — Progress Notes (Signed)
[facility] Progress Note     Subjective: CC: right chest pain Patient still having right sided chest pain that he feels is mostly well controlled, has occasional episodes of significantly worse pain. Very willing to maximize non-narcotic methods of pain control. Tolerating diet and having bowel function. Denies SOB. Using IS, has been able to get to 1500 so far this AM. Reports IS gets better as he uses throughout the day. Feels like he may be getting a shingles outbreak on his forehead.   Objective: Vital signs in last 24 hours: Temp:  [98.4 F (36.9 C)-99.3 F (37.4 C)] 98.8 F (37.1 C) (02/03 0403) Pulse Rate:  [89-92] 90 (02/03 0403) Resp:  [15-19] 18 (02/03 0403) BP: (130-167)/(63-86) 167/76 (02/03 0403) SpO2:  [97 %-98 %] 97 % (02/03 0403) Last BM Date: 04/27/18  Intake/Output from previous day: 02/02 0701 - 02/03 0700 In: 1514 [P.O.:1514] Out: 2590 [Urine:2400; Chest Tube:190] Intake/Output this shift: No intake/output data recorded.  PE: Gen:  Alert, NAD, pleasant ENT: red raised area without drainage or scab to left forhead Card:  Regular rate and rhythm Pulm:  Normal effort, basilar rales on the right, left lung CTA Abd: Soft, non-tender, non-distended, bowel sounds present  Psych: A&Ox3   Lab Results:  No results for input(s): WBC, HGB, HCT, PLT in the last 72 hours. BMET No results for input(s): NA, K, CL, CO2, GLUCOSE, BUN, CREATININE, CALCIUM in the last 72 hours. PT/INR No results for input(s): LABPROT, INR in the last 72 hours. CMP     Component Value Date/Time   NA 135 04/26/2018 0329   K 4.2 04/26/2018 0329   CL 101 04/26/2018 0329   CO2 25 04/26/2018 0329   GLUCOSE 118 (H) 04/26/2018 0329   BUN 12 04/26/2018 0329   CREATININE 0.75 04/26/2018 0329   CREATININE 0.68 12/15/2011 1350   CALCIUM 8.3 (L) 04/26/2018 0329   PROT 6.7 04/26/2018 0329   ALBUMIN 3.1 (L) 04/26/2018 0329   AST 41 04/26/2018 0329   ALT 32 04/26/2018 0329   ALKPHOS 56 04/26/2018 0329   BILITOT 1.7 (H) 04/26/2018 0329   GFRNONAA >60 04/26/2018 0329   GFRNONAA >89 12/15/2011 1350   GFRAA >60 04/26/2018 0329   GFRAA >89 12/15/2011 1350   Lipase  No results found for: LIPASE     Studies/Results: Dg Chest Port 1 View  Result Date: 04/29/2018 CLINICAL DATA:  Chest tube in place. EXAM: PORTABLE CHEST 1 VIEW COMPARISON:  Radiograph of April 28, 2018. FINDINGS: The heart size and mediastinal contours are within normal limits. Right-sided chest tube is unchanged in position. No definite pneumothorax is noted. Stable right-sided rib fractures are noted with overlying subcutaneous emphysema. Mild bibasilar subsegmental atelectasis is noted. IMPRESSION: Stable position of right-sided chest tube without definite pneumothorax seen. Mild bibasilar subsegmental atelectasis is noted with overlying subcutaneous emphysema. Mild bibasilar subsegmental atelectasis. Electronically Signed   By: Lupita Raider, M.D.   On: 04/29/2018 07:50    Anti-infectives: Anti-infectives (From admission, onward)   None       Assessment/Plan Fall from tree Right rib FX 2-9- pain control, IS, PT/OT, ambulate with assist Right hemopneumothorax- CT in place with190cc out24h - CXRpending, continue on waterseal until output under 100cc Hep C/Cirrhosis- hasn't been on lasix or aldactone in 6 years. INR is 1.3 ?shingles - start acyclovir  FEN -regular diet/SLIV,prnoxy 15-20mg  q 4hrs, increased gabapentin and robaxin dosage VTE -SCDs/Lovenox ID- none needed, [facility] 1/31 recheck today, afeb  Dispo: CT to  WS. PT/OT recommending HH therapies.CXR and CBC pending  LOS: 7 days    Wells GuilesKelly Rayburn , [facility] 04/30/2018, 7:54 AM Pager: [phone]

## 2018-04-30 NOTE — Progress Notes (Signed)
Physical Therapy Treatment Patient Details Name: Francisco Floyd MRN: [id number] DOB: 1956/01/08 Today's Date: 04/30/2018    History of Present Illness 63 yo admitted after fall from tree with Rt rib fx 2-9 with HPTX. PMhx: hep C and cirrhosis    PT Comments    Continuing work on functional mobility and activity tolerance; Progressed to not needing RW today, and walked the entire 6N unit; noted DOE 2/4, but O2 sats remained greater than or equal to 93% throughout; very good progress -- may not need HHPT follow-up   Follow Up Recommendations  Home health PT;Supervision - Intermittent     Equipment Recommendations  3in1 (PT)    Recommendations for Other Services       Precautions / Restrictions Precautions Precautions: Fall Precaution Comments: right chest tube  Restrictions Weight Bearing Restrictions: No    Mobility  Bed Mobility Overal bed mobility: Modified Independent Bed Mobility: Supine to Sit     Supine to sit: Modified independent (Device/Increase time);HOB elevated     General bed mobility comments: use of rail. Will be sleeping in recliner at home to start out  Transfers Overall transfer level: Needs assistance   Transfers: Sit to/from Stand Sit to Stand: Supervision         General transfer comment: Supervision for safety  Ambulation/Gait Ambulation/Gait assistance: Supervision Gait Distance (Feet): 550 Feet Assistive device: None(and occasionally pushing vitals machine) Gait Pattern/deviations: Step-through pattern Gait velocity: WFL   General Gait Details: Steady gait; cues for deep, focused breathing; O2 sats ranged 92-98% with amb on room air   Stairs             Wheelchair Mobility    Modified Rankin (Stroke Patients Only)       Balance Overall balance assessment: Needs assistance Sitting-balance support: Feet supported;No upper extremity supported Sitting balance-Leahy Scale: Good     Standing balance support: No upper  extremity supported;During functional activity Standing balance-Leahy Scale: Fair(approaching Good)                              Cognition Arousal/Alertness: Awake/alert Behavior During Therapy: WFL for tasks assessed/performed Overall Cognitive Status: Within Functional Limits for tasks assessed                                        Exercises      General Comments        Pertinent Vitals/Pain Pain Assessment: 0-10 Pain Score: 8  Faces Pain Scale: Hurts even more Pain Location: right side when he moves Pain Descriptors / Indicators: Guarding;Sore Pain Intervention(s): Monitored during session    Home Living                      Prior Function            PT Goals (current goals can now be found in the care plan section) Acute Rehab PT Goals Patient Stated Goal: return home PT Goal Formulation: With patient Time For Goal Achievement: 05/08/18 Potential to Achieve Goals: Good Progress towards PT goals: Progressing toward goals    Frequency    Min 3X/week      PT Plan Current plan remains appropriate    Co-evaluation              AM-PAC PT "6 Clicks" Mobility   Outcome Measure  Help  needed turning from your back to your side while in a flat bed without using bedrails?: None Help needed moving from lying on your back to sitting on the side of a flat bed without using bedrails?: A Little Help needed moving to and from a bed to a chair (including a wheelchair)?: A Little Help needed standing up from a chair using your arms (e.g., wheelchair or bedside chair)?: A Little Help needed to walk in hospital room?: A Little Help needed climbing 3-5 steps with a railing? : A Little 6 Click Score: 19    End of Session   Activity Tolerance: Patient tolerated treatment well Patient left: in chair;with call bell/phone within reach Nurse Communication: Mobility status PT Visit Diagnosis: Other abnormalities of gait and  mobility (R26.89);Difficulty in walking, not elsewhere classified (R26.2)     Time: 1430(times are approximate)-1450 PT Time Calculation (min) (ACUTE ONLY): 20 min  Charges:  $Gait Training: 8-22 mins                     Van ClinesHolly Coreena Rubalcava, [place]  Acute Rehabilitation Services Pager [phone] Office [phone]    Levi AlandHolly H Taye Cato 04/30/2018, 4:34 PM

## 2018-04-30 NOTE — Progress Notes (Signed)
Occupational Therapy Treatment Patient Details Name: Francisco Floyd MRN: [id number] DOB: 11/08/1955 Today's Date: 04/30/2018    History of present illness 63 yo admitted after fall from tree with Rt rib fx 2-9 with HPTX. PMhx: hep C and cirrhosis   OT comments  This 63 yo admitted with above presents to acute OT making excellent progress despite his pain. He is now an overall min guard A level with ambulation for basic ADLs and set up/S when in one place for ADLs. He will continue to benefit from acute OT now without need for follow up.  Follow Up Recommendations  No OT follow up    Equipment Recommendations  3 in 1 bedside commode       Precautions / Restrictions Precautions Precautions: Fall Precaution Comments: right chest tube  Restrictions Weight Bearing Restrictions: No       Mobility Bed Mobility Overal bed mobility: Modified Independent Bed Mobility: Supine to Sit     Supine to sit: Modified independent (Device/Increase time);HOB elevated     General bed mobility comments: use of rail. Will be sleeping in recliner at home to start out  Transfers Overall transfer level: Needs assistance   Transfers: Sit to/from Stand Sit to Stand: Supervision              Balance Overall balance assessment: Needs assistance Sitting-balance support: Feet supported;No upper extremity supported Sitting balance-Leahy Scale: Good     Standing balance support: No upper extremity supported;During functional activity Standing balance-Leahy Scale: Fair                             ADL either performed or assessed with clinical judgement   ADL Overall ADL's : Needs assistance/impaired     Grooming: Min guard;Standing               Lower Body Dressing: Set up;Bed level Lower Body Dressing Details (indicate cue type and reason): supine with HOB partially up to don socks; did determine once pt in chair that he could don socks sitting in recliner and crossing  legs Toilet Transfer: Min Pension scheme manager Details (indicate cue type and reason): simulated from bed to recliner                 Vision Patient Visual Report: No change from baseline            Cognition Arousal/Alertness: Awake/alert Behavior During Therapy: WFL for tasks assessed/performed Overall Cognitive Status: Within Functional Limits for tasks assessed                                                     Pertinent Vitals/ Pain       Pain Assessment: 0-10 Pain Score: 7  Pain Location: right side when he moves Pain Descriptors / Indicators: Guarding;Sore Pain Intervention(s): Limited activity within patient's tolerance;Monitored during session     Prior Functioning/Environment              Frequency  Min 2X/week        Progress Toward Goals  OT Goals(current goals can now be found in the care plan section)  Progress towards OT goals: Progressing toward goals     Plan Discharge plan needs to be updated       AM-PAC OT "6 Clicks" Daily  Activity     Outcome Measure   Help from another person eating meals?: None Help from another person taking care of personal grooming?: A Little Help from another person toileting, which includes using toliet, bedpan, or urinal?: A Little Help from another person bathing (including washing, rinsing, drying)?: A Little Help from another person to put on and taking off regular upper body clothing?: A Little Help from another person to put on and taking off regular lower body clothing?: A Little 6 Click Score: 19    End of Session Equipment Utilized During Treatment: Rolling walker  OT Visit Diagnosis: Unsteadiness on feet (R26.81);Pain Pain - Right/Left: Right Pain - part of body: (side (ribs))   Activity Tolerance Patient tolerated treatment well   Patient Left (ambulating with PT)   Nurse Communication          Time: 9833-8250 OT Time Calculation (min): 12 min  Charges: OT  General Charges $OT Visit: 1 Visit OT Treatments $Self Care/Home Management : 8-22 mins  Ignacia Palma, OTR/L Acute [employer] Pager [phone] Office [phone]      Evette Georges 04/30/2018, 2:46 PM

## 2018-05-01 ENCOUNTER — Encounter (HOSPITAL_COMMUNITY): Payer: Self-pay | Admitting: General Practice

## 2018-05-01 ENCOUNTER — Inpatient Hospital Stay (HOSPITAL_COMMUNITY): Payer: Medicare HMO

## 2018-05-01 MED ORDER — BISACODYL 10 MG RE SUPP: 10.0000 mg | Freq: Every day | RECTAL | Status: DC | PRN

## 2018-05-01 NOTE — Discharge Summary (Signed)
Physician Discharge Summary  Patient ID: Francisco Floyd MRN: [id number] DOB/AGE: 29-Nov-1955 63 y.o.  Admit date: 04/23/2018 Discharge date: 05/04/2018  Discharge Diagnoses Fall from tree Right ribs 2-9 fractures with hemopneumothorax Cirrhosis Shingles  Consultants None  Procedures 1. Right chest tube placement - 04/23/18 Dr. Violeta Gelinas  HPI: This is a 63 yo white male with a history of Hep C and cirrhosis. He last saw someone for this about 6 years ago. He was taking Lasix and Aldactone, but hasn't taken this since 2014. He was in a tree today cutting limbs and fell about 12 feet. He was brought in as a level 2 trauma. He was found to have right ribs 2-9 fractures with subsequent right hemopneumothorax. He was sating around 96% on 2L via nasal cannula. Trauma service asked to see him for admission.  He denied pain anywhere else except in his right chest. He denied LOC or syncope. Chest tube placed in the emergency room and patient admitted to the trauma service.   Hospital Course: Patient had high output from chest tube during hospitalization and chest tube was maintained for several days. 05/03/18 chest tube was clamped and patient tolerated this well, follow up CXR was stable 05/04/18. Chest tube removed 05/04/18 and follow up CXR revealed no PTX and stable small pleural effusion. Patient takes chronic opioids and sees pain management, his usual dose on 15 mg oxycodone every 4 hrs as needed was increased to 15-20 mg every 4 hrs as needed for acute pain. Gabapentin, robaxin, ibuprofen were scheduled to assist in pain management. Tramadol 50 mg every 6 hrs as needed added for breakthrough pain. This was discussed with patient's pain management clinic and he will need to schedule a follow up appointment with them next week to re-establish care for chronic pain control.   Patient worked with therapies during admission and progressed to not needing any home therapy. On 05/04/18 patient discharged home  in stable condition with follow up as outlined below. He knows to call with questions or concerns.   I or a member of my team have looked this patient up in the Controlled Substance Database and reviewed their medications.  Allergies as of 05/04/2018      Reactions   Flagyl [metronidazole Hcl]    Unknown to family.      Medication List    STOP taking these medications   furosemide 40 MG tablet Commonly known as:  LASIX   spironolactone 100 MG tablet Commonly known as:  ALDACTONE     TAKE these medications   acyclovir 800 MG tablet Commonly known as:  ZOVIRAX Take 1 tablet (800 mg total) by mouth 5 (five) times daily for 2 days.   bisacodyl 10 MG suppository Commonly known as:  DULCOLAX Place 1 suppository (10 mg total) rectally daily as needed for mild constipation.   gabapentin 600 MG tablet Commonly known as:  NEURONTIN Take 1 tablet (600 mg total) by mouth 3 (three) times daily.   ibuprofen 400 MG tablet Commonly known as:  ADVIL,MOTRIN Take 2 tablets (800 mg total) by mouth 3 (three) times daily.   methocarbamol 500 MG tablet Commonly known as:  ROBAXIN Take 2 tablets (1,000 mg total) by mouth 4 (four) times daily.   OVER THE COUNTER MEDICATION Take 1 capsule by mouth daily. Kratom supplement for pain relief   Oxycodone HCl 20 MG Tabs Take 0.5-1 tablets (10-20 mg total) by mouth every 4 (four) hours as needed for up to 7 days for severe  pain. What changed:    medication strength  how much to take  reasons to take this   senna-docusate 8.6-50 MG tablet Commonly known as:  Senokot-S Take 1 tablet by mouth 2 (two) times daily.   traMADol 50 MG tablet Commonly known as:  ULTRAM Take 1 tablet (50 mg total) by mouth every 6 (six) hours as needed (mild pain).        Follow-up Information    [facility]. Go on 05/15/2018.   Why:  Appointment scheduled for 9:00 AM. Bring photo ID and insurance information. Please arrive 30 min prior to appointment  time.  Contact information: [address] [place] [address] [phone]       [facility]. Go on 05/14/2018.   Why:  Go to get chest x-ray the day prior to trauma clinic appointment. You can call ahead of time to schedule or walk-in during normal business hours. Contact information: [address] [place] [address] [phone]        [facility] management. Call.   Why:  Call and schedule an appointment to be seen next week.  Contact information: [address] [place], [place] [address] [phone]       Gordan Payment., MD. Call.   Specialty:  Internal Medicine Why:  Call and schedule a post-hospitalization follow up appointment in 1-2 weeks.  Contact information: [address] [place] [address] [phone]           Signed: Wells Guiles , [facility] Surgery 05/04/2018, 9:38 AM Pager: [phone]

## 2018-05-01 NOTE — Progress Notes (Signed)
[facility] Progress Note     Subjective: CC: pain in R chest Pain worse with movement. Denies SOB. Has not had a BM since 1/31. Feels like shingles is coming to a head.   Objective: Vital signs in last 24 hours: Temp:  [98.6 F (37 C)-99.9 F (37.7 C)] 98.6 F (37 C) (02/04 0512) Pulse Rate:  [89-115] 89 (02/04 0512) Resp:  [15-18] 17 (02/04 0512) BP: (136-142)/(70-81) 136/72 (02/04 0512) SpO2:  [93 %-97 %] 95 % (02/04 0512) Last BM Date: 04/27/18  Intake/Output from previous day: 02/03 0701 - 02/04 0700 In: 1457 [P.O.:1457] Out: 2340 [Urine:1900; Chest Tube:440] Intake/Output this shift: No intake/output data recorded.  PE: Gen:  Alert, NAD, pleasant ENT: red raised area without drainage or scab to left forhead Card:  Regular rate and rhythm Pulm:  Normal effort, basilar rales on the right, left lung CTA Abd: Soft, non-tender, non-distended, bowel sounds present  Psych: A&Ox3   Lab Results:  Recent Labs    04/30/18 0755  WBC 15.9*  HGB 11.1*  HCT 31.1*  PLT 257   BMET No results for input(s): NA, K, CL, CO2, GLUCOSE, BUN, CREATININE, CALCIUM in the last 72 hours. PT/INR No results for input(s): LABPROT, INR in the last 72 hours. CMP     Component Value Date/Time   NA 135 04/26/2018 0329   K 4.2 04/26/2018 0329   CL 101 04/26/2018 0329   CO2 25 04/26/2018 0329   GLUCOSE 118 (H) 04/26/2018 0329   BUN 12 04/26/2018 0329   CREATININE 0.75 04/26/2018 0329   CREATININE 0.68 12/15/2011 1350   CALCIUM 8.3 (L) 04/26/2018 0329   PROT 6.7 04/26/2018 0329   ALBUMIN 3.1 (L) 04/26/2018 0329   AST 41 04/26/2018 0329   ALT 32 04/26/2018 0329   ALKPHOS 56 04/26/2018 0329   BILITOT 1.7 (H) 04/26/2018 0329   GFRNONAA >60 04/26/2018 0329   GFRNONAA >89 12/15/2011 1350   GFRAA >60 04/26/2018 0329   GFRAA >89 12/15/2011 1350   Lipase  No results found for: LIPASE     Studies/Results: Dg Chest Port 1 View  Result Date: 04/30/2018 CLINICAL DATA:   Right midthorax follow-up. EXAM: PORTABLE CHEST 1 VIEW COMPARISON:  Chest x-ray from yesterday. FINDINGS: Unchanged right apical pigtail chest tube. The heart size and mediastinal contours are within normal limits. Normal pulmonary vascularity. Unchanged mild bibasilar atelectasis/scarring. No focal consolidation, pleural effusion, or pneumothorax. Unchanged right-sided rib fractures. IMPRESSION: 1. Stable right apical pigtail chest tube.  No pneumothorax. 2. Unchanged mild bibasilar atelectasis/scarring. Electronically Signed   By: Obie Dredge M.D.   On: 04/30/2018 08:14    Anti-infectives: Anti-infectives (From admission, onward)   Start     Dose/Rate Route Frequency Ordered Stop   04/30/18 1000  acyclovir (ZOVIRAX) tablet 800 mg     800 mg Oral 5 times daily 04/30/18 0801 05/07/18 0959       Assessment/Plan Fall from tree Right rib FX 2-9- pain control, IS, PT/OT, ambulate with assist Right hemopneumothorax- CT in place with440cc out24h -CXRstable, continue on waterseal until output under 100cc Hep C/Cirrhosis- hasn't been on lasix or aldactone in 6 years. INR is 1.3 ?shingles - continue acyclovir  FEN -regular diet/SLIV,prnoxy 15-20mg  q 4hrs, continue gabapentin and robaxin  VTE -SCDs/Lovenox ID- none needed, [facility] 1/31 recheck today, afeb  Dispo: [facility] to [facility]. PT/OT recommending HH therapies.CXR stable  LOS: 8 days    Wells Guiles , [facility] Surgery 05/01/2018, 8:42 AM Pager: [phone]

## 2018-05-02 ENCOUNTER — Inpatient Hospital Stay (HOSPITAL_COMMUNITY): Payer: Medicare HMO

## 2018-05-02 MED ORDER — BISACODYL 10 MG RE SUPP
10.0000 mg | Freq: Once | RECTAL | Status: AC
  Administered 2018-05-02: 10 mg via RECTAL
  Filled 2018-05-02: qty 1

## 2018-05-02 NOTE — Progress Notes (Signed)
Occupational Therapy Treatment Patient Details Name: Francisco Floyd Perazzo MRN: [id number] DOB: 06/19/1955 Today's Date: 05/02/2018    History of present illness 63 yo admitted after fall from tree with Rt rib fx 2-9 with HPTX. PMhx: hep C and cirrhosis   OT comments  Pt is a 63 yo male s/p above dx. Pt performing LB ADL with no difficulty and continues to use figure 4 technique. Pt tolerating toilet hygiene and clothing management with supervisionA simulating use of toilet aide. Pt performing UB ADL with Modified independence. Pt performing ADL functional mobility with no AD and fair balance. Pt tolerating session well. OT signing off as pt modified independent with mobility and ADL performance. Pt aware rib pain will continue to get better. O2 sats >93% on RA throughout exertion.   Follow Up Recommendations  No OT follow up    Equipment Recommendations  3 in 1 bedside commode    Recommendations for Other Services      Precautions / Restrictions Precautions Precautions: Fall Precaution Comments: right chest tube  Restrictions Weight Bearing Restrictions: No       Mobility Bed Mobility Overal bed mobility: Modified Independent Bed Mobility: Supine to Sit           General bed mobility comments: use of rail. Will be sleeping in recliner at home to start out  Transfers Overall transfer level: Needs assistance Equipment used: None Transfers: Sit to/from Stand Sit to Stand: Supervision         General transfer comment: Supervision for safety    Balance Overall balance assessment: Needs assistance   Sitting balance-Leahy Scale: Good       Standing balance-Leahy Scale: Fair                             ADL either performed or assessed with clinical judgement   ADL Overall ADL's : Needs assistance/impaired                 Upper Body Dressing : Min guard;Sitting   Lower Body Dressing: Minimal assistance;Sitting/lateral leans;Sit to/from stand(plans  to buy toilet aide)   Toilet Transfer: Supervision/safety   Toileting- Clothing Manipulation and Hygiene: Supervision/safety;With adaptive equipment       Functional mobility during ADLs: Supervision/safety General ADL Comments: Pt with increased pain for LB ADL, but pt does well with figure 4 technique.     Vision   Vision Assessment?: No apparent visual deficits   Perception     Praxis      Cognition Arousal/Alertness: Awake/alert Behavior During Therapy: WFL for tasks assessed/performed Overall Cognitive Status: Within Functional Limits for tasks assessed                                 General Comments: in good spirits and plans to have neighbors checking on him at home.        Exercises     Shoulder Instructions       General Comments      Pertinent Vitals/ Pain       Pain Assessment: 0-10 Pain Score: 5  Pain Location: right side when he moves Pain Descriptors / Indicators: Guarding;Sore Pain Intervention(s): Limited activity within patient's tolerance;Repositioned  Home Living  Prior Functioning/Environment              Frequency  Min 2X/week        Progress Toward Goals  OT Goals(current goals can now be found in the care plan section)  Progress towards OT goals: Progressing toward goals  Acute Rehab OT Goals Patient Stated Goal: return home OT Goal Formulation: With patient Time For Goal Achievement: 05/08/18 Potential to Achieve Goals: Good ADL Goals Pt Will Perform Grooming: with modified independence;standing Pt Will Perform Upper Body Bathing: with set-up;sitting Pt Will Perform Lower Body Dressing: sit to/from stand;with supervision;with set-up;with adaptive equipment Pt Will Transfer to Toilet: with supervision;bedside commode;ambulating;regular height toilet Pt/caregiver will Perform Home Exercise Program: Right Upper extremity;Increased ROM;With written  HEP provided  Plan Discharge plan needs to be updated    Co-evaluation                 AM-PAC OT "6 Clicks" Daily Activity     Outcome Measure   Help from another person eating meals?: None Help from another person taking care of personal grooming?: A Little Help from another person toileting, which includes using toliet, bedpan, or urinal?: A Little Help from another person bathing (including washing, rinsing, drying)?: A Little Help from another person to put on and taking off regular upper body clothing?: A Little Help from another person to put on and taking off regular lower body clothing?: A Little 6 Click Score: 19    End of Session    OT Visit Diagnosis: Unsteadiness on feet (R26.81);Pain Pain - Right/Left: Right   Activity Tolerance Patient tolerated treatment well   Patient Left in chair;with call bell/phone within reach   Nurse Communication Mobility status        Time: 3536-1443 OT Time Calculation (min): 14 min  Charges: OT General Charges $OT Visit: 1 Visit OT Treatments $Self Care/Home Management : 8-22 mins  Cristi Loron) Glendell Docker OTR/L Acute Rehabilitation Services Pager: [phone] Office: [phone]    Sandrea Hughs 05/02/2018, 2:09 PM

## 2018-05-02 NOTE — Progress Notes (Signed)
Physical Therapy Treatment Patient Details Name: Francisco Floyd MRN: [id number] DOB: [birth year] Today's Date: 05/02/2018    History of Present Illness 63 yo admitted after fall from tree with Rt rib fx 2-9 with HPTX. PMhx: hep C and cirrhosis    PT Comments    Continuing work on functional mobility and activity tolerance;  Overall walking very well; We discussed (and I encouraged) more hallway walking -- I would like for him to walk with O2 sat monitoring with vitals machine, and he can hang the Pleuravac on the vitals machine basket; Staff would need to assist with setup, but once that's done, he can safely walk the hallways;   Excellent progress, and I've updated dc recs, as I don't feel super strongly that he needs HHPT at this point; He has met the majority of acute PT goals; I'm keeping him on PT caseload, with the plans of seeing how he does once the chest tube is dc'd  Follow Up Recommendations  No PT follow up     Equipment Recommendations  3in1 (PT)    Recommendations for Other Services       Precautions / Restrictions Precautions Precautions: Fall Precaution Comments: right chest tube     Mobility  Bed Mobility Overal bed mobility: Modified Independent Bed Mobility: Supine to Sit           General bed mobility comments: use of rail. Will be sleeping in recliner at home to start out  Transfers Overall transfer level: Needs assistance Equipment used: None Transfers: Sit to/from Stand Sit to Stand: Supervision         General transfer comment: Supervision for safety  Ambulation/Gait Ambulation/Gait assistance: Supervision Gait Distance (Feet): 600 Feet Assistive device: None(and pushing Vitals Machine) Gait Pattern/deviations: Step-through pattern Gait velocity: WFL   General Gait Details: Steady gait; cues for deep, focused breathing; O2 sats ranged 92-98% with amb on room air; walk included going up and down an incline   Stairs              Wheelchair Mobility    Modified Rankin (Stroke Patients Only)       Balance     Sitting balance-Leahy Scale: Good       Standing balance-Leahy Scale: Fair(approaching Good)                              Cognition Arousal/Alertness: Awake/alert Behavior During Therapy: WFL for tasks assessed/performed Overall Cognitive Status: Within Functional Limits for tasks assessed                                 General Comments: He laughs and jokes; we talked about him walking the hallways independently, just getting staff to set him up with a vitals machine he can hang pleuravac on and monitor O2 sats       Exercises      General Comments        Pertinent Vitals/Pain Pain Assessment: 0-10 Pain Score: 8  Pain Location: right side when he moves Pain Descriptors / Indicators: Guarding;Sore Pain Intervention(s): Monitored during session    Home Living                      Prior Function            PT Goals (current goals can now be found in the care plan section)  Acute Rehab PT Goals Patient Stated Goal: return home PT Goal Formulation: With patient Time For Goal Achievement: 05/08/18 Potential to Achieve Goals: Good Progress towards PT goals: Progressing toward goals(Majority of goals met)    Frequency    Min 3X/week      PT Plan Current plan remains appropriate    Co-evaluation              AM-PAC PT "6 Clicks" Mobility   Outcome Measure  Help needed turning from your back to your side while in a flat bed without using bedrails?: None Help needed moving from lying on your back to sitting on the side of a flat bed without using bedrails?: None Help needed moving to and from a bed to a chair (including a wheelchair)?: None Help needed standing up from a chair using your arms (e.g., wheelchair or bedside chair)?: None Help needed to walk in hospital room?: None Help needed climbing 3-5 steps with a railing? : A  Little 6 Click Score: 23    End of Session   Activity Tolerance: Patient tolerated treatment well Patient left: in chair;with call bell/phone within reach Nurse Communication: Mobility status PT Visit Diagnosis: Other abnormalities of gait and mobility (R26.89);Difficulty in walking, not elsewhere classified (R26.2)     Time: 8101-7510 PT Time Calculation (min) (ACUTE ONLY): 19 min  Charges:  $Gait Training: 8-22 mins                     Roney Marion, [place]  Acute Rehabilitation Services Pager [phone] Office [phone]    Colletta [place] 05/02/2018, 12:04 PM

## 2018-05-02 NOTE — Progress Notes (Signed)
[facility] Progress Note     Subjective: CC: R rib pain Patient with stable pain control. No BM, will try suppository today. IV removed overnight, but patient not requiring any IV fluids or meds. Shingles improving  Objective: Vital signs in last 24 hours: Temp:  [98.3 F (36.8 C)-99.1 F (37.3 C)] 98.8 F (37.1 C) (02/05 0553) Pulse Rate:  [91-94] 93 (02/05 0553) Resp:  [17-18] 17 (02/05 0553) BP: (122-138)/(70-82) 122/70 (02/05 0553) SpO2:  [96 %-98 %] 98 % (02/05 0553) Last BM Date: 04/27/18  Intake/Output from previous day: 02/04 0701 - 02/05 0700 In: 360 [P.O.:360] Out: 600 [Urine:600] Intake/Output this shift: Total I/O In: -  Out: 120 [Chest Tube:120]  PE: Gen: Alert, NAD, pleasant ENT: red area without drainage to left forehead Card: Regular rate and rhythm Pulm: Normal effort, basilar rales on the right, left lung CTA Abd: Soft, non-tender, non-distended, bowel sounds present  Psych: A&Ox3    Lab Results:  Recent Labs    04/30/18 0755  WBC 15.9*  HGB 11.1*  HCT 31.1*  PLT 257   BMET No results for input(s): NA, K, CL, CO2, GLUCOSE, BUN, CREATININE, CALCIUM in the last 72 hours. PT/INR No results for input(s): LABPROT, INR in the last 72 hours. CMP     Component Value Date/Time   NA 135 04/26/2018 0329   K 4.2 04/26/2018 0329   CL 101 04/26/2018 0329   CO2 25 04/26/2018 0329   GLUCOSE 118 (H) 04/26/2018 0329   BUN 12 04/26/2018 0329   CREATININE 0.75 04/26/2018 0329   CREATININE 0.68 12/15/2011 1350   CALCIUM 8.3 (L) 04/26/2018 0329   PROT 6.7 04/26/2018 0329   ALBUMIN 3.1 (L) 04/26/2018 0329   AST 41 04/26/2018 0329   ALT 32 04/26/2018 0329   ALKPHOS 56 04/26/2018 0329   BILITOT 1.7 (H) 04/26/2018 0329   GFRNONAA >60 04/26/2018 0329   GFRNONAA >89 12/15/2011 1350   GFRAA >60 04/26/2018 0329   GFRAA >89 12/15/2011 1350   Lipase  No results found for: LIPASE     Studies/Results: Dg Chest Port 1 View  Result  Date: 05/01/2018 CLINICAL DATA:  Right-sided hemothorax EXAM: PORTABLE CHEST 1 VIEW COMPARISON:  04/30/18 FINDINGS: Cardiac shadows within normal limits. Linear bibasilar densities are noted bilaterally stable from previous exam consistent with underlying atelectasis/scarring. Right-sided pigtail chest tube is again noted and stable. No pneumothorax is seen. Multiple right rib fractures are noted. No acute infiltrate is seen. IMPRESSION: No evidence of pneumothorax. Stable atelectasis/scarring in the bases bilaterally. Electronically Signed   By: Alcide CleverMark  Lukens M.D.   On: 05/01/2018 08:44    Anti-infectives: Anti-infectives (From admission, onward)   Start     Dose/Rate Route Frequency Ordered Stop   04/30/18 1000  acyclovir (ZOVIRAX) tablet 800 mg     800 mg Oral 5 times daily 04/30/18 0801 05/07/18 0959       Assessment/Plan Fall from tree Right rib FX 2-9- pain control, IS, PT/OT, ambulate with assist Right hemopneumothorax- CT in place with~200cc out24h -CXRstable, continue on waterseal until output under 100cc Hep C/Cirrhosis- hasn't been on lasix or aldactone in 6 years. INR is 1.3 ?shingles - continue acyclovir  FEN -regular diet/SLIV,prnoxy 15-20mg  q 4hrs,continue gabapentin and robaxin  VTE -SCDs/Lovenox ID- none needed, [id number] today, afeb  [facility] PT/OT recommending HH therapies.CXR stable - will discuss with MD possible chest CT vs trial of clamping chest tube today   LOS: 9 days  Wells Guiles , [facility] 05/02/2018, 8:15 AM Pager: [phone]

## 2018-05-02 NOTE — Care Management Important Message (Signed)
Important Message  Patient Details  Name: Francisco Floyd MRN: [id number] Date of Birth: December 13, 1955   Medicare Important Message Given:  Yes    Jermiya Reichl Stefan Church 05/02/2018, 4:10 PM

## 2018-05-03 MED ORDER — BISACODYL 10 MG RE SUPP: 10.0000 mg | Freq: Every day | RECTAL | Status: DC | PRN

## 2018-05-03 MED ORDER — METHOCARBAMOL 500 MG PO TABS
1000.0000 mg | ORAL_TABLET | Freq: Four times a day (QID) | ORAL | Status: DC
  Administered 2018-05-03 – 2018-05-04 (×6): 1000 mg via ORAL
  Filled 2018-05-03 (×6): qty 2

## 2018-05-03 MED ORDER — GABAPENTIN 400 MG PO CAPS
500.0000 mg | ORAL_CAPSULE | Freq: Three times a day (TID) | ORAL | Status: DC
  Administered 2018-05-03 – 2018-05-04 (×4): 500 mg via ORAL
  Filled 2018-05-03 (×4): qty 1

## 2018-05-03 NOTE — Care Management Note (Signed)
Case Management Note  Patient Details  Name: Francisco Floyd MRN: [id number] Date of Birth: Sep 30, 1955  Subjective/Objective:   63 yo admitted after fall from tree with Rt rib fx 2-9 with HPTX.  PTA, pt independent, lives alone.                   Action/Plan: PT/OT recommending HH follow up, and pt agreeable to therapies.  He states family members and a neighbor will be able to assist at dc.  Will follow to arrange HH/DME prior to dc.  Expected Discharge Date:                  Expected Discharge Plan:  Home/Self Care  In-House Referral:     Discharge planning Services  CM Consult  Post Acute Care Choice:    Choice offered to:     DME Arranged:    DME Agency:     HH Arranged:    HH Agency:     Status of Service:  Completed, signed off  If discussed at [employer] of Stay Meetings, dates discussed:    Additional Comments:  04/27/2018 J. Konnor Vondrasek, RN, BSN Pt still with chest tube; output too high to dc yet.  Referral to [facility] for [facility] follow up at dc; start of care 24-48h post dc date.  Pt states he has needed DME at home, as his brother recently passed away.   05-29-18 J. Isidoro Santillana, RN, BSN PT/OT now recommending no OP follow up; notified [facility] that pt will not need HH after all.  Offered to get 3 in 1 [facility] for pt, but he declines, as he cannot afford copay (20% cost of DME).  Pt states he will use his brother's if he needs it.  Informed pt of cancellation of HH, and he agrees with this.    Francisco Baton, RN, BSN  Trauma/Neuro ICU Case Manager [phone]

## 2018-05-03 NOTE — Progress Notes (Signed)
[facility] Progress Note     Subjective: CC: R rib pain Patient reports pain seems worse in the AM and overnight. Having trouble sleeping secondary to pain. Mobilizing well. Had a BM yesterday after suppository.   Objective: Vital signs in last 24 hours: Temp:  [98.2 F (36.8 C)-98.8 F (37.1 C)] 98.6 F (37 C) (02/06 0537) Pulse Rate:  [86-96] 94 (02/06 0537) Resp:  [16-18] 18 (02/06 0537) BP: (111-141)/(58-84) 141/84 (02/06 0537) SpO2:  [93 %-97 %] 97 % (02/06 0537) Last BM Date: 04/27/18  Intake/Output from previous day: 02/05 0701 - 02/06 0700 In: 1300 [P.O.:1300] Out: 1355 [Urine:1125; Chest Tube:230] Intake/Output this shift: No intake/output data recorded.  PE: Gen: Alert, NAD, pleasant ENT: red area without drainage to left forehead Card: Regular rate and rhythm Pulm: Normal effort, minimal basilar rales on the right, left lung CTA Abd: Soft, non-tender, non-distended, bowel sounds present, ecchymosis to right flank/hip Psych: A&Ox3  Lab Results:  No results for input(s): WBC, HGB, HCT, PLT in the last 72 hours. BMET No results for input(s): NA, K, CL, CO2, GLUCOSE, BUN, CREATININE, CALCIUM in the last 72 hours. PT/INR No results for input(s): LABPROT, INR in the last 72 hours. CMP     Component Value Date/Time   NA 135 04/26/2018 0329   K 4.2 04/26/2018 0329   CL 101 04/26/2018 0329   CO2 25 04/26/2018 0329   GLUCOSE 118 (H) 04/26/2018 0329   BUN 12 04/26/2018 0329   CREATININE 0.75 04/26/2018 0329   CREATININE 0.68 12/15/2011 1350   CALCIUM 8.3 (L) 04/26/2018 0329   PROT 6.7 04/26/2018 0329   ALBUMIN 3.1 (L) 04/26/2018 0329   AST 41 04/26/2018 0329   ALT 32 04/26/2018 0329   ALKPHOS 56 04/26/2018 0329   BILITOT 1.7 (H) 04/26/2018 0329   GFRNONAA >60 04/26/2018 0329   GFRNONAA >89 12/15/2011 1350   GFRAA >60 04/26/2018 0329   GFRAA >89 12/15/2011 1350   Lipase  No results found for: LIPASE     Studies/Results: Dg Chest  Port 1 View  Result Date: 05/02/2018 CLINICAL DATA:  Right-sided hemothorax EXAM: PORTABLE CHEST 1 VIEW COMPARISON:  Yesterday FINDINGS: Right apical chest tube in stable position. Mild hazy density and volume loss on the right where there are numerous rib fractures. No visible pneumothorax. Stable right chest wall emphysema. Mild atelectasis on the left. Normal heart size. IMPRESSION: 1. Stable chest tube positioning with no evidence of pneumothorax or increasing pleural fluid. 2. Atelectasis. Electronically Signed   By: Marnee Spring M.D.   On: 05/02/2018 09:21    Anti-infectives: Anti-infectives (From admission, onward)   Start     Dose/Rate Route Frequency Ordered Stop   04/30/18 1000  acyclovir (ZOVIRAX) tablet 800 mg     800 mg Oral 5 times daily 04/30/18 0801 05/07/18 0959       Assessment/Plan Fall from tree Right rib FX 2-9- pain control, IS, PT/OT, ambulate with assist Right hemopneumothorax- CT in place with~230cc out24h Hep C/Cirrhosis- hasn't been on lasix or aldactone in 6 years. INR is 1.3 ?shingles -continueacyclovir  FEN -regular diet/SLIV,prnoxy 15-20mg  q 4hrs,continuegabapentin and robaxin  VTE -SCDs/Lovenox ID- none needed, afebrile  Dispo: CT clamped - continue until tomorrow AM and repeat CXR.   LOS: 10 days    Wells Guiles , [facility] 05/03/2018, 8:12 AM Pager: [phone]

## 2018-05-04 ENCOUNTER — Inpatient Hospital Stay (HOSPITAL_COMMUNITY): Payer: Medicare HMO

## 2018-05-04 LAB — COMPREHENSIVE METABOLIC PANEL
ALT: 20 U/L (ref 0–44)
AST: 27 U/L (ref 15–41)
Albumin: 2.3 g/dL — ABNORMAL LOW (ref 3.5–5.0)
Alkaline Phosphatase: 134 U/L — ABNORMAL HIGH (ref 38–126)
Anion gap: 8 (ref 5–15)
BILIRUBIN TOTAL: 0.7 mg/dL (ref 0.3–1.2)
BUN: 7 mg/dL — ABNORMAL LOW (ref 8–23)
CO2: 27 mmol/L (ref 22–32)
Calcium: 8.3 mg/dL — ABNORMAL LOW (ref 8.9–10.3)
Chloride: 103 mmol/L (ref 98–111)
Creatinine, Ser: 0.8 mg/dL (ref 0.61–1.24)
GFR calc Af Amer: >60 mL/min (ref >60)
GFR calc non Af Amer: >60 mL/min (ref >60)
Glucose, Bld: 92 mg/dL (ref 70–99)
Potassium: 3.8 mmol/L (ref 3.5–5.1)
Sodium: 138 mmol/L (ref 135–145)
TOTAL PROTEIN: 5.4 g/dL — AB (ref 6.5–8.1)

## 2018-05-04 LAB — CBC
HCT: 32.4 % — ABNORMAL LOW (ref 39.0–52.0)
HEMOGLOBIN: 10.7 g/dL — AB (ref 13.0–17.0)
MCH: 30.8 pg (ref 26.0–34.0)
MCHC: 33 g/dL (ref 30.0–36.0)
MCV: 93.4 fL (ref 80.0–100.0)
Platelets: 260 10*3/uL (ref 150–400)
RBC: 3.47 MIL/uL — ABNORMAL LOW (ref 4.22–5.81)
RDW: 14.1 % (ref 11.5–15.5)
WBC: 10.8 10*3/uL — ABNORMAL HIGH (ref 4.0–10.5)
nRBC: 0 % (ref 0.0–0.2)

## 2018-05-04 MED ORDER — SENNOSIDES-DOCUSATE SODIUM 8.6-50 MG PO TABS: 1.0000 | ORAL_TABLET | Freq: Two times a day (BID) | ORAL | Status: DC

## 2018-05-04 MED ORDER — OXYCODONE HCL 20 MG PO TABS: 10.0000 mg | ORAL_TABLET | ORAL | 0 refills | Status: AC | PRN

## 2018-05-04 MED ORDER — BISACODYL 10 MG RE SUPP: 10.0000 mg | Freq: Every day | RECTAL | 0 refills | Status: DC | PRN

## 2018-05-04 MED ORDER — GABAPENTIN 600 MG PO TABS: 600.0000 mg | ORAL_TABLET | Freq: Three times a day (TID) | ORAL | 0 refills | Status: DC

## 2018-05-04 MED ORDER — TRAMADOL HCL 50 MG PO TABS: 50.0000 mg | ORAL_TABLET | Freq: Four times a day (QID) | ORAL | 1 refills | Status: DC | PRN

## 2018-05-04 MED ORDER — METHOCARBAMOL 500 MG PO TABS: 1000.0000 mg | ORAL_TABLET | Freq: Four times a day (QID) | ORAL | 1 refills | Status: DC

## 2018-05-04 MED ORDER — IBUPROFEN 400 MG PO TABS: 800.0000 mg | ORAL_TABLET | Freq: Three times a day (TID) | ORAL | 0 refills | Status: DC

## 2018-05-04 MED ORDER — ACYCLOVIR 800 MG PO TABS: 800.0000 mg | ORAL_TABLET | Freq: Every day | ORAL | 0 refills | Status: AC

## 2018-05-04 NOTE — Progress Notes (Signed)
Physical Therapy Treatment Patient Details Name: Francisco Floyd MRN: [id number] DOB: [birth year] Today's Date: 05/04/2018    History of Present Illness 63 yo admitted after fall from tree with Rt rib fx 2-9 with HPTX. PMhx: hep C and cirrhosis    PT Comments    Pt awaiting chest x-ray from chest tube removal for clearance to d/c home, however pt willing to perform stair training. Pt is limited in safe mobility by pain with movement, but only requires supervision for transfers, ambulation and ascent/descent of 4 steps. PT appropriate for d/c home today once chest x-ray cleared.    Follow Up Recommendations  No PT follow up     Equipment Recommendations  3in1 (PT)       Precautions / Restrictions Precautions Precautions: Fall Precaution Comments: chest tube removed 2/7 Restrictions Weight Bearing Restrictions: No    Mobility  Bed Mobility Overal bed mobility: Modified Independent Bed Mobility: Supine to Sit           General bed mobility comments: use of rail. Will be sleeping in recliner at home to start out  Transfers Overall transfer level: Needs assistance Equipment used: None Transfers: Sit to/from Stand Sit to Stand: Supervision         General transfer comment: Supervision for safety  Ambulation/Gait Ambulation/Gait assistance: Supervision Gait Distance (Feet): 450 Feet Assistive device: None Gait Pattern/deviations: Step-through pattern Gait velocity: WFL Gait velocity interpretation: >4.37 ft/sec, indicative of normal walking speed General Gait Details: Steady gait; cues for deep, focused breathing; O2 sats ranged 92-98% with amb on room air; walk included going up and down an incline   Stairs Stairs: Yes Stairs assistance: Supervision Stair Management: One rail Right;Alternating pattern;Forwards Number of Stairs: 4 General stair comments: strong steady ascent/descent       Balance     Sitting balance-Leahy Scale: Good       Standing  balance-Leahy Scale: Fair(approaching Good)                              Cognition Arousal/Alertness: Awake/alert Behavior During Therapy: WFL for tasks assessed/performed Overall Cognitive Status: Within Functional Limits for tasks assessed                                 General Comments: ready for d/c home         General Comments General comments (skin integrity, edema, etc.): Pt awiating chest x-ray for clearance to d/c.      Pertinent Vitals/Pain Pain Assessment: 0-10 Pain Score: 5  Pain Location: right side when he moves Pain Descriptors / Indicators: Guarding;Sore           PT Goals (current goals can now be found in the care plan section) Acute Rehab PT Goals Patient Stated Goal: return home PT Goal Formulation: With patient Time For Goal Achievement: 05/08/18 Potential to Achieve Goals: Good Progress towards PT goals: Progressing toward goals    Frequency    Min 3X/week      PT Plan Current plan remains appropriate       AM-PAC PT "6 Clicks" Mobility   Outcome Measure  Help needed turning from your back to your side while in a flat bed without using bedrails?: None Help needed moving from lying on your back to sitting on the side of a flat bed without using bedrails?: None Help needed moving to and from a  bed to a chair (including a wheelchair)?: None Help needed standing up from a chair using your arms (e.g., wheelchair or bedside chair)?: None Help needed to walk in hospital room?: None Help needed climbing 3-5 steps with a railing? : A Little 6 Click Score: 23    End of Session Equipment Utilized During Treatment: Gait belt Activity Tolerance: Patient tolerated treatment well Patient left: in chair;with call bell/phone within reach Nurse Communication: Mobility status PT Visit Diagnosis: Other abnormalities of gait and mobility (R26.89);Difficulty in walking, not elsewhere classified (R26.2)     Time:  2010-0712 PT Time Calculation (min) (ACUTE ONLY): 11 min  Charges:  $Gait Training: 8-22 mins                     Zackrey Dyar B. Beverely Risen PT, DPT Acute Rehabilitation Services Pager [phone] Office [phone]    Elon Alas Fleet 05/04/2018, 3:45 PM

## 2018-05-04 NOTE — Progress Notes (Signed)
[facility] Progress Note     Subjective: CC: no new complaints Pain stable. Denies SOB. Tolerating diet and having bowel function.   Objective: Vital signs in last 24 hours: Temp:  [97.8 F (36.6 C)-98.6 F (37 C)] 98.6 F (37 C) (02/07 0511) Pulse Rate:  [84-107] 84 (02/07 0511) Resp:  [18-20] 20 (02/07 0511) BP: (130-157)/(72-86) 141/77 (02/07 0511) SpO2:  [96 %-99 %] 96 % (02/07 0511) Last BM Date: 05/03/18  Intake/Output from previous day: 02/06 0701 - 02/07 0700 In: 600 [P.O.:600] Out: 1695 [Urine:1400; Chest Tube:295] Intake/Output this shift: No intake/output data recorded.  PE: Gen: Alert, NAD, pleasant ENT: red area without drainagetoleft forehead Card: Regular rate and rhythm Pulm: Normal effort, lungs CTAB Abd: Soft, non-tender, non-distended, bowel sounds present, ecchymosis to right flank/hip Psych: A&Ox3   Lab Results:  Recent Labs    05/04/18 0500  WBC 10.8*  HGB 10.7*  HCT 32.4*  PLT 260   BMET Recent Labs    05/04/18 0500  NA 138  K 3.8  CL 103  CO2 27  GLUCOSE 92  BUN 7*  CREATININE 0.80  CALCIUM 8.3*   PT/INR No results for input(s): LABPROT, INR in the last 72 hours. CMP     Component Value Date/Time   NA 138 05/04/2018 0500   K 3.8 05/04/2018 0500   CL 103 05/04/2018 0500   CO2 27 05/04/2018 0500   GLUCOSE 92 05/04/2018 0500   BUN 7 (L) 05/04/2018 0500   CREATININE 0.80 05/04/2018 0500   CREATININE 0.68 12/15/2011 1350   CALCIUM 8.3 (L) 05/04/2018 0500   PROT 5.4 (L) 05/04/2018 0500   ALBUMIN 2.3 (L) 05/04/2018 0500   AST 27 05/04/2018 0500   ALT 20 05/04/2018 0500   ALKPHOS 134 (H) 05/04/2018 0500   BILITOT 0.7 05/04/2018 0500   GFRNONAA >60 05/04/2018 0500   GFRNONAA >89 12/15/2011 1350   GFRAA >60 05/04/2018 0500   GFRAA >89 12/15/2011 1350   Lipase  No results found for: LIPASE     Studies/Results: Dg Chest Port 1 View  Result Date: 05/04/2018 CLINICAL DATA:  Followup right  hemothorax and chest tube. EXAM: PORTABLE CHEST 1 VIEW COMPARISON:  05/02/2018 and multiple previous FINDINGS: Right pigtail chest tube unchanged with its tip near the apex. No pneumothorax. Small amount of pleural density does persist consistent with mild residual pleural blood/fluid. Mild atelectasis on the right. Multiple right rib fractures appear the same. Left lung is clear except for minimal atelectasis at the base. IMPRESSION: No change. Chest tube remains on the right. No pneumothorax. Small amount of residual pleural density. Mild atelectasis. Right rib fractures. Electronically Signed   By: Paulina Fusi M.D.   On: 05/04/2018 07:42    Anti-infectives: Anti-infectives (From admission, onward)   Start     Dose/Rate Route Frequency Ordered Stop   04/30/18 1000  acyclovir (ZOVIRAX) tablet 800 mg     800 mg Oral 5 times daily 04/30/18 0801 05/07/18 0959       Assessment/Plan Fall from tree Right rib FX 2-9- pain control, IS, PT/OT, ambulate with assist Right hemopneumothorax- CXR this AM stable, CT removed - repeat CXR this afternoon and likely d/c home if looks ok  Hep C/Cirrhosis- hasn't been on lasix or aldactone in 6 years. INR is 1.3 ?shingles -continueacyclovir  FEN -regular diet/SLIV,prnoxy 15-20mg  q 4hrs,continuegabapentin and robaxin  VTE -SCDs/Lovenox ID- none needed, afebrile  Dispo: Likely d/c this afternoon after CXR  LOS: 11 days  Wells GuilesKelly Rayburn , [facility] 05/04/2018, 9:06 AM Pager: [phone]

## 2018-05-16 ENCOUNTER — Other Ambulatory Visit: Payer: Self-pay

## 2018-05-16 ENCOUNTER — Inpatient Hospital Stay (HOSPITAL_COMMUNITY)
Admission: EM | Admit: 2018-05-16 | Discharge: 2018-05-22 | DRG: 167 | Disposition: A | Discharge status: Home / Self Care | Payer: Medicare HMO | Attending: Thoracic Surgery (Cardiothoracic Vascular Surgery) | Admitting: Thoracic Surgery (Cardiothoracic Vascular Surgery)

## 2018-05-16 ENCOUNTER — Emergency Department (HOSPITAL_COMMUNITY): Payer: Medicare HMO

## 2018-05-16 ENCOUNTER — Encounter (HOSPITAL_COMMUNITY): Payer: Self-pay | Admitting: General Practice

## 2018-05-16 DIAGNOSIS — Z79899 Other long term (current) drug therapy: Secondary | ICD-10-CM

## 2018-05-16 DIAGNOSIS — Z881 Allergy status to other antibiotic agents status: Secondary | ICD-10-CM | POA: Diagnosis not present

## 2018-05-16 DIAGNOSIS — J90 Pleural effusion, not elsewhere classified: Secondary | ICD-10-CM | POA: Diagnosis not present

## 2018-05-16 DIAGNOSIS — R000 Tachycardia, unspecified: Secondary | ICD-10-CM | POA: Diagnosis not present

## 2018-05-16 DIAGNOSIS — Z09 Encounter for follow-up examination after completed treatment for conditions other than malignant neoplasm: Secondary | ICD-10-CM

## 2018-05-16 DIAGNOSIS — Z87891 Personal history of nicotine dependence: Secondary | ICD-10-CM | POA: Diagnosis not present

## 2018-05-16 DIAGNOSIS — Z79891 Long term (current) use of opiate analgesic: Secondary | ICD-10-CM

## 2018-05-16 DIAGNOSIS — K7460 Unspecified cirrhosis of liver: Secondary | ICD-10-CM | POA: Diagnosis present

## 2018-05-16 DIAGNOSIS — G894 Chronic pain syndrome: Secondary | ICD-10-CM | POA: Diagnosis present

## 2018-05-16 DIAGNOSIS — E669 Obesity, unspecified: Secondary | ICD-10-CM | POA: Diagnosis present

## 2018-05-16 DIAGNOSIS — Z9889 Other specified postprocedural states: Secondary | ICD-10-CM

## 2018-05-16 DIAGNOSIS — B1920 Unspecified viral hepatitis C without hepatic coma: Secondary | ICD-10-CM | POA: Diagnosis present

## 2018-05-16 DIAGNOSIS — Z6834 Body mass index (BMI) 34.0-34.9, adult: Secondary | ICD-10-CM | POA: Diagnosis not present

## 2018-05-16 DIAGNOSIS — D649 Anemia, unspecified: Secondary | ICD-10-CM | POA: Diagnosis present

## 2018-05-16 DIAGNOSIS — R0602 Shortness of breath: Secondary | ICD-10-CM | POA: Diagnosis present

## 2018-05-16 DIAGNOSIS — I454 Nonspecific intraventricular block: Secondary | ICD-10-CM | POA: Diagnosis not present

## 2018-05-16 DIAGNOSIS — S2241XA Multiple fractures of ribs, right side, initial encounter for closed fracture: Secondary | ICD-10-CM | POA: Diagnosis present

## 2018-05-16 DIAGNOSIS — Z4682 Encounter for fitting and adjustment of non-vascular catheter: Secondary | ICD-10-CM

## 2018-05-16 DIAGNOSIS — W14XXXA Fall from tree, initial encounter: Secondary | ICD-10-CM | POA: Diagnosis not present

## 2018-05-16 DIAGNOSIS — R079 Chest pain, unspecified: Secondary | ICD-10-CM

## 2018-05-16 HISTORY — DX: Pleural effusion, not elsewhere classified: J90

## 2018-05-16 LAB — CBC
HCT: 34.8 % — ABNORMAL LOW (ref 39.0–52.0)
Hemoglobin: 11 g/dL — ABNORMAL LOW (ref 13.0–17.0)
MCH: 29.4 pg (ref 26.0–34.0)
MCHC: 31.6 g/dL (ref 30.0–36.0)
MCV: 93 fL (ref 80.0–100.0)
Platelets: 231 10*3/uL (ref 150–400)
RBC: 3.74 MIL/uL — AB (ref 4.22–5.81)
RDW: 13.3 % (ref 11.5–15.5)
WBC: 5.2 10*3/uL (ref 4.0–10.5)
nRBC: 0 % (ref 0.0–0.2)

## 2018-05-16 LAB — BASIC METABOLIC PANEL
Anion gap: 7 (ref 5–15)
BUN: 5 mg/dL — ABNORMAL LOW (ref 8–23)
CO2: 27 mmol/L (ref 22–32)
Calcium: 8.3 mg/dL — ABNORMAL LOW (ref 8.9–10.3)
Chloride: 105 mmol/L (ref 98–111)
Creatinine, Ser: 0.7 mg/dL (ref 0.61–1.24)
GFR calc Af Amer: >60 mL/min (ref >60)
Glucose, Bld: 96 mg/dL (ref 70–99)
POTASSIUM: 3.9 mmol/L (ref 3.5–5.1)
Sodium: 139 mmol/L (ref 135–145)

## 2018-05-16 MED ORDER — OXYCODONE HCL 5 MG PO TABS
20.0000 mg | ORAL_TABLET | ORAL | Status: DC | PRN
  Administered 2018-05-16 – 2018-05-17 (×4): 20 mg via ORAL
  Filled 2018-05-16 (×4): qty 4

## 2018-05-16 MED ORDER — FENTANYL CITRATE (PF) 100 MCG/2ML IJ SOLN
50.0000 ug | Freq: Once | INTRAMUSCULAR | Status: AC
  Administered 2018-05-16: 50 ug via INTRAVENOUS
  Filled 2018-05-16: qty 2

## 2018-05-16 MED ORDER — SENNOSIDES-DOCUSATE SODIUM 8.6-50 MG PO TABS
1.0000 | ORAL_TABLET | Freq: Two times a day (BID) | ORAL | Status: DC
  Administered 2018-05-16: 1 via ORAL
  Filled 2018-05-16: qty 1

## 2018-05-16 MED ORDER — PANTOPRAZOLE SODIUM 40 MG PO TBEC
40.0000 mg | DELAYED_RELEASE_TABLET | Freq: Every day | ORAL | Status: DC
  Administered 2018-05-18 – 2018-05-22 (×5): 40 mg via ORAL
  Filled 2018-05-16 (×5): qty 1

## 2018-05-16 MED ORDER — ENOXAPARIN SODIUM 40 MG/0.4ML ~~LOC~~ SOLN
40.0000 mg | SUBCUTANEOUS | Status: DC
  Administered 2018-05-16: 40 mg via SUBCUTANEOUS
  Filled 2018-05-16 (×2): qty 0.4

## 2018-05-16 MED ORDER — DOCUSATE SODIUM 100 MG PO CAPS
100.0000 mg | ORAL_CAPSULE | Freq: Two times a day (BID) | ORAL | Status: DC
  Administered 2018-05-16: 100 mg via ORAL
  Filled 2018-05-16: qty 1

## 2018-05-16 MED ORDER — METHOCARBAMOL 500 MG PO TABS
500.0000 mg | ORAL_TABLET | Freq: Four times a day (QID) | ORAL | Status: DC | PRN
  Administered 2018-05-16 – 2018-05-19 (×3): 500 mg via ORAL
  Filled 2018-05-16 (×3): qty 1

## 2018-05-16 MED ORDER — ONDANSETRON HCL 4 MG/2ML IJ SOLN: 4.0000 mg | Freq: Four times a day (QID) | INTRAMUSCULAR | Status: DC | PRN

## 2018-05-16 MED ORDER — HYDRALAZINE HCL 20 MG/ML IJ SOLN: 10.0000 mg | INTRAMUSCULAR | Status: DC | PRN

## 2018-05-16 MED ORDER — OXYCODONE HCL 5 MG PO TABS
20.0000 mg | ORAL_TABLET | Freq: Four times a day (QID) | ORAL | Status: DC | PRN
  Administered 2018-05-16: 20 mg via ORAL
  Filled 2018-05-16: qty 4

## 2018-05-16 MED ORDER — ONDANSETRON 4 MG PO TBDP: 4.0000 mg | ORAL_TABLET | Freq: Four times a day (QID) | ORAL | Status: DC | PRN

## 2018-05-16 MED ORDER — TRAMADOL HCL 50 MG PO TABS
50.0000 mg | ORAL_TABLET | Freq: Four times a day (QID) | ORAL | Status: DC | PRN
  Administered 2018-05-16: 50 mg via ORAL
  Filled 2018-05-16: qty 1

## 2018-05-16 MED ORDER — SODIUM CHLORIDE 0.9 % IV SOLN
INTRAVENOUS | Status: DC
  Administered 2018-05-16: 14:00:00 via INTRAVENOUS

## 2018-05-16 MED ORDER — MORPHINE SULFATE (PF) 2 MG/ML IV SOLN
2.0000 mg | INTRAVENOUS | Status: DC | PRN
  Administered 2018-05-17 (×3): 2 mg via INTRAVENOUS
  Filled 2018-05-16 (×3): qty 1

## 2018-05-16 MED ORDER — PANTOPRAZOLE SODIUM 40 MG IV SOLR
40.0000 mg | Freq: Every day | INTRAVENOUS | Status: DC
  Filled 2018-05-16: qty 40

## 2018-05-16 NOTE — ED Triage Notes (Signed)
Pt was recently admitted after a fall and had a pneumothorax, since discharge he had a follow up chest CT and it was abnormal, sent back here for further evaluation, states the fluid is not draining off of his lungs as expected, denies pain, shortness of breath with exertion

## 2018-05-16 NOTE — ED Triage Notes (Signed)
Pt called by Dr. Dwain Sarna last night and asked to come to ED immediately but pt did not have a ride, no distress noted

## 2018-05-16 NOTE — H&P (View-Only) (Signed)
Reason for Consult: Loculated right pleural effusion Referring Physician: Violeta Gelinas, MD  Francisco Floyd is an 63 y.o. male.  HPI: The patient is a 63 year old male who was recently hospitalized at [facility] from 04/23/2018 till  05/11/2018 after falling out of a tree and suffering multiple right rib fractures and a right hemopneumothorax requiring chest tube placement.  The patient was seen in the trauma clinic yesterday and was complaining of intermittent chest pain from the rib fractures and shortness of breath.  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  A CT scan of the chest was obtained and showed a worsening large right pleural effusion which was complex in nature.  This study is not in epic.  Chest x-ray does show a right-sided effusion with atelectatic changes.  We are asked to see the patient in thoracic surgical consultation for possible surgical intervention.  Past Medical History:  Diagnosis Date  . Cirrhosis of liver (HCC)   . Hepatitis C   . Rib fractures     Past Surgical History:  Procedure Laterality Date  . INCISION AND DRAINAGE ABSCESS     groin  . TONSILLECTOMY      No family history on file.  Social History:  reports that he quit smoking about 20 years ago. His smoking use included cigarettes. He has never used smokeless tobacco. He reports current alcohol use. He reports that he does not use drugs.  Allergies:  Allergies  Allergen Reactions  . Flagyl [Metronidazole Hcl]     Unknown to family.    Medications:  Current Meds  Medication Sig  . Oxycodone HCl 20 MG TABS Take 20 mg by mouth every 6 (six) hours as needed (severe pain).  Marland Kitchen senna-docusate (SENOKOT-S) 8.6-50 MG tablet Take 1 tablet by mouth 2 (two) times daily.  . [DISCONTINUED] gabapentin (NEURONTIN) 600 MG tablet Take 1 tablet (600 mg total) by mouth 3 (three) times daily.   . [DISCONTINUED] ibuprofen (ADVIL,MOTRIN) 400 MG tablet Take 2 tablets (800 mg total) by mouth 3 (three) times daily. (Patient taking differently: Take 800 mg by mouth every 8 (eight) hours as needed for moderate pain. )  . [DISCONTINUED] methocarbamol (ROBAXIN) 500 MG tablet Take 2 tablets (1,000 mg total) by mouth 4 (four) times daily.  . [DISCONTINUED] OVER THE COUNTER MEDICATION Take 1 capsule by mouth daily. Kratom supplement for pain relief  . [DISCONTINUED] oxymetazoline (AFRIN) 0.05 % nasal spray Place 1 spray into both nostrils 2 (two) times daily as needed for congestion.  . [DISCONTINUED] traMADol (ULTRAM) 50 MG tablet Take 1 tablet (50 mg total) by mouth every 6 (six) hours as needed (mild pain).    Results for orders placed or performed during the hospital encounter of 05/16/18 (from the past 48 hour(s))  CBC     Status: Abnormal   Collection Time: 05/16/18 12:41 PM  Result Value Ref Range   WBC 5.2 4.0 - 10.5 K/uL   RBC 3.74 (L) 4.22 - 5.81 MIL/uL   Hemoglobin 11.0 (L) 13.0 - 17.0 g/dL   HCT 28.4 (L) 13.2 - 44.0 %   MCV 93.0 80.0 - 100.0 fL   MCH 29.4 26.0 - 34.0 pg   MCHC 31.6 30.0 - 36.0 g/dL   RDW 10.2 72.5 - 36.6 %   Platelets 231 150 - 400 K/uL   nRBC 0.0 0.0 - 0.2 %  Comment: Performed at [facility], [address], [place], [place] [address]  Basic metabolic panel     Status: Abnormal   Collection Time: 05/16/18 12:41 PM  Result Value Ref Range   Sodium 139 135 - 145 mmol/L   Potassium 3.9 3.5 - 5.1 mmol/L   Chloride 105 98 - 111 mmol/L   CO2 27 22 - 32 mmol/L   Glucose, Bld 96 70 - 99 mg/dL   BUN 5 (L) 8 - 23 mg/dL   Creatinine, Ser 6.04 0.61 - 1.24 mg/dL   Calcium 8.3 (L) 8.9 - 10.3 mg/dL   GFR calc non Af Amer >60 >60 mL/min   GFR calc Af Amer >60 >60 mL/min   Anion gap 7 5 - 15    Comment: Performed at [facility], [address], [place], [place] [address]    Dg Chest 2 View  Result Date: 05/16/2018 CLINICAL DATA:  Increasing right  pleural effusion. Multiple recent right rib fractures. EXAM: CHEST - 2 VIEW COMPARISON:  Chest CTs dated 05/15/2018 and 05/14/2018 and chest x-ray dated 05/14/2018 FINDINGS: There is a persistent large loculated right pleural effusion with almost complete atelectasis of the right lower lobe with compressive atelectasis of some of the right upper lobe. On the lateral view there appears to be slight decrease in the effusion anteriorly as compared to the prior chest x-ray but unchanged since the prior CT of 05/15/2018. There multiple angulated and slightly displaced right posterolateral rib fractures as previously noted. Heart size and vascularity are normal. Minimal linear scarring or atelectasis at the left lung base. IMPRESSION: Persistent large right pleural effusion, unchanged since yesterday. Persistent almost complete atelectasis of the right upper lobe with partial atelectasis of the right upper lobe due to the extensive loculated effusion. Electronically Signed   By: Francene Boyers M.D.   On: 05/16/2018 11:25    Review of Systems  Constitutional: Positive for diaphoresis. Negative for chills, fever, malaise/fatigue and weight loss.  HENT: Negative.   Eyes: Negative.   Respiratory: Positive for cough, sputum production and shortness of breath.   Cardiovascular: Positive for chest pain and palpitations. Negative for orthopnea, claudication, leg swelling and PND.  Gastrointestinal: Positive for constipation and nausea. Negative for blood in stool, diarrhea, melena and vomiting.  Genitourinary: Negative.   Musculoskeletal: Positive for back pain and neck pain.  Skin: Negative for itching and rash.  Neurological: Positive for dizziness. Negative for tingling, tremors, sensory change, speech change, focal weakness, seizures, loss of consciousness, weakness and headaches.  Endo/Heme/Allergies: Negative.   Psychiatric/Behavioral: Negative.    Blood pressure (!) 165/107, pulse 76, temperature 97.8 F  (36.6 C), temperature source Oral, resp. rate 18, height 5\' 7"  (1.702 m), weight 100.7 kg, SpO2 97 %. Physical Exam  Constitutional: He is oriented to person, place, and time. He appears well-developed and well-nourished. No distress.  HENT:  Head: Normocephalic and atraumatic.  Mouth/Throat: Oropharynx is clear and moist. No oropharyngeal exudate.  Full dentures  Eyes: Pupils are equal, round, and reactive to light. Conjunctivae and EOM are normal. Right eye exhibits no discharge. Left eye exhibits no discharge. No scleral icterus.  Neck: Normal range of motion. Neck supple. No JVD present. No tracheal deviation present. No thyromegaly present.  Cardiovascular: Normal rate, regular rhythm and intact distal pulses. Exam reveals gallop. Exam reveals no friction rub.  No murmur heard. Respiratory: Effort normal and breath sounds normal. No stridor. No respiratory distress. He has no wheezes. He has  no rales. He exhibits tenderness.  + right sided post rib tenderness, dim BS in right base   GI: Soft. He exhibits no distension and no mass. There is no abdominal tenderness. There is no rebound and no guarding.  Musculoskeletal:        General: No tenderness, deformity or edema.  Neurological: He is alert and oriented to person, place, and time.  Skin: Skin is warm and dry. No rash noted. He is not diaphoretic. No erythema. No pallor.  Bruising right hip region  Psychiatric: He has a normal mood and affect. His behavior is normal. Judgment and thought content normal.    Assessment/Plan: Complex right pleural effusion Plan: Possible video-assisted thoracoscopy for decortication  Wayne E Gold PA-C 05/16/2018, 2:24 PM   Patient seen and examined, agree with above Recent fall with multiple rib fractures and right hemopneumothorax. Discharged last week. Readmitted with increased right pleural effusion. Some shortness of breath and some rib pain as well. His rib pain was not severe, although he is  on chronic narcotics (oxycodone 15 mg Q6) for back pain. Today he is having some right posterior chest wall, paraspinal pain. Rib fractures show some callous formation c/w healing fractures.   He needs a right VATS to drain the loculated pleural effusion. Thoracentesis and bedside CT unlikely to drain all the fluid. I do not think rib plating would be particularly helpful at this point.   I discussed the general nature of the procedure, the need for general anesthesia, the incisions to be used and the use of a drainage tube(s) postoperatively with Mr. Boehne. We discussed the expected hospital stay, overall recovery and short and long term outcomes. I informed him of the indications, risks, benefits and alternatives. He understands the risks include but are not limited to death, stroke, MI, DVT/PE, bleeding, possible need for transfusion, infections, irregular heart rhythms, as well as other organ system dysfunction including respiratory, renal, or GI complications.  He accepts the risks and agrees to proceed.  Plan OR tomorrow Am  Hiba Garry C. Reichen Hutzler, MD [facility] [phone]   

## 2018-05-16 NOTE — ED Provider Notes (Signed)
[facility] Provider Note   CSN: [id number] Arrival date & time: 05/16/18  [id number]    History   Chief Complaint Chief Complaint  Patient presents with  . Post-op Problem    HPI Francisco MallowClifton Linebaugh is a 63 y.o. male.     The history is provided by the patient and medical records. No language interpreter was used.  Shortness of Breath  Severity:  Moderate Onset quality:  Gradual Duration:  4 days Timing:  Constant Progression:  Waxing and waning Chronicity:  New Relieved by:  Nothing Worsened by:  Deep breathing and exertion Ineffective treatments:  None tried Associated symptoms: chest pain   Associated symptoms: no abdominal pain, no cough, no diaphoresis, no fever, no headaches, no hemoptysis, no neck pain, no rash, no sputum production, no vomiting and no wheezing   Risk factors comment:  Recent trauma with chest tube   Past Medical History:  Diagnosis Date  . Cirrhosis of liver (HCC)   . Hepatitis C   . Rib fractures     Patient Active Problem List   Diagnosis Date Noted  . Fall 04/23/2018  . Multiple fractures of ribs, right side, initial encounter for closed fracture 04/23/2018  . Hemopneumothorax on right 04/23/2018  . Hepatitis C 04/23/2018  . Cirrhosis (HCC) 04/23/2018    Past Surgical History:  Procedure Laterality Date  . INCISION AND DRAINAGE ABSCESS     groin  . TONSILLECTOMY          Home Medications    Prior to Admission medications   Medication Sig Start Date End Date Taking? Authorizing Provider  bisacodyl (DULCOLAX) 10 MG suppository Place 1 suppository (10 mg total) rectally daily as needed for mild constipation. 05/04/18   Rayburn, Alphonsus SiasKelly A, PA-C  gabapentin (NEURONTIN) 600 MG tablet Take 1 tablet (600 mg total) by mouth 3 (three) times daily. 05/04/18   Rayburn, Alphonsus SiasKelly A, PA-C  ibuprofen (ADVIL,MOTRIN) 400 MG tablet Take 2 tablets (800 mg total) by mouth 3 (three) times daily. 05/04/18   Rayburn, Alphonsus SiasKelly A,  PA-C  methocarbamol (ROBAXIN) 500 MG tablet Take 2 tablets (1,000 mg total) by mouth 4 (four) times daily. 05/04/18   Rayburn, Alphonsus SiasKelly A, PA-C  OVER THE COUNTER MEDICATION Take 1 capsule by mouth daily. Kratom supplement for pain relief    [provider]  senna-docusate (SENOKOT-S) 8.6-50 MG tablet Take 1 tablet by mouth 2 (two) times daily. 05/04/18   Rayburn, Alphonsus SiasKelly A, PA-C  traMADol (ULTRAM) 50 MG tablet Take 1 tablet (50 mg total) by mouth every 6 (six) hours as needed (mild pain). 05/04/18   Rayburn, Alphonsus SiasKelly A, PA-C    Family History No family history on file.  Social History Social History   Tobacco Use  . Smoking status: Former Smoker    Types: Cigarettes    Last attempt to quit: 04/23/1998    Years since quitting: 20.0  . Smokeless tobacco: Never Used  Substance Use Topics  . Alcohol use: Yes    Comment: occasionally  . Drug use: Never     Allergies   Flagyl [metronidazole hcl]   Review of Systems Review of Systems  Constitutional: Negative for chills, diaphoresis, fatigue and fever.  HENT: Negative for congestion.   Eyes: Negative for visual disturbance.  Respiratory: Positive for chest tightness and shortness of breath. Negative for cough, hemoptysis, sputum production, wheezing and stridor.   Cardiovascular: Positive for chest pain. Negative for palpitations and leg swelling.  Gastrointestinal: Negative for abdominal  pain, constipation, diarrhea, nausea and vomiting.  Genitourinary: Negative for flank pain.  Musculoskeletal: Negative for back pain, neck pain and neck stiffness.  Skin: Negative for rash.  Neurological: Negative for light-headedness and headaches.  Psychiatric/Behavioral: Negative for agitation.  All other systems reviewed and are negative.    Physical Exam Updated Vital Signs BP (!) 170/92 (BP Location: Right Arm)   Pulse (!) 105   Temp 97.7 F (36.5 C) (Oral)   Resp 20   Ht 5\' 7"  (1.702 m)   Wt 100.7 kg   SpO2 95%   BMI 34.77 kg/m    Physical Exam Vitals signs and nursing note reviewed.  Constitutional:      General: He is not in acute distress.    Appearance: He is well-developed. He is not ill-appearing, toxic-appearing or diaphoretic.  HENT:     Head: Normocephalic and atraumatic.     Nose: No congestion or rhinorrhea.     Mouth/Throat:     Pharynx: No oropharyngeal exudate or posterior oropharyngeal erythema.  Eyes:     Conjunctiva/sclera: Conjunctivae normal.     Pupils: Pupils are equal, round, and reactive to light.  Neck:     Musculoskeletal: Neck supple. No muscular tenderness.  Cardiovascular:     Rate and Rhythm: Normal rate and regular rhythm.     Heart sounds: No murmur.  Pulmonary:     Effort: Pulmonary effort is normal. No tachypnea or respiratory distress.     Breath sounds: Examination of the right-middle field reveals decreased breath sounds and rales. Examination of the right-lower field reveals decreased breath sounds, rhonchi and rales. Decreased breath sounds, rhonchi and rales present. No wheezing.  Chest:     Chest wall: Tenderness present.    Abdominal:     Palpations: Abdomen is soft.     Tenderness: There is no abdominal tenderness.  Musculoskeletal:        General: Tenderness present.     Thoracic back: He exhibits tenderness and pain.       Back:     Right lower leg: No edema.     Left lower leg: No edema.  Skin:    General: Skin is warm and dry.     Capillary Refill: Capillary refill takes less than 2 seconds.  Neurological:     Mental Status: He is alert.  Psychiatric:        Mood and Affect: Mood normal.      ED Treatments / Results  Labs (all labs ordered are listed, but only abnormal results are displayed) Labs Reviewed - No data to display  EKG EKG Interpretation  Date/Time:  Wednesday May 16 2018 09:55:11 EST Ventricular Rate:  100 PR Interval:    QRS Duration: 136 QT Interval:  392 QTC Calculation: 506 R Axis:   -3 Text Interpretation:   Sinus tachycardia Right bundle branch block When comapred to prior, no significant changes seen.  No STEMI Confirmed by Theda Belfastegeler, Chris ([id number]) on 05/16/2018 10:10:30 AM   Radiology Dg Chest 2 View  Result Date: 05/16/2018 CLINICAL DATA:  Increasing right pleural effusion. Multiple recent right rib fractures. EXAM: CHEST - 2 VIEW COMPARISON:  Chest CTs dated 05/15/2018 and 05/14/2018 and chest x-ray dated 05/14/2018 FINDINGS: There is a persistent large loculated right pleural effusion with almost complete atelectasis of the right lower lobe with compressive atelectasis of some of the right upper lobe. On the lateral view there appears to be slight decrease in the effusion anteriorly as compared to the prior  chest x-ray but unchanged since the prior CT of 05/15/2018. There multiple angulated and slightly displaced right posterolateral rib fractures as previously noted. Heart size and vascularity are normal. Minimal linear scarring or atelectasis at the left lung base. IMPRESSION: Persistent large right pleural effusion, unchanged since yesterday. Persistent almost complete atelectasis of the right upper lobe with partial atelectasis of the right upper lobe due to the extensive loculated effusion. Electronically Signed   By: Francene Boyers M.D.   On: 05/16/2018 11:25    Procedures Procedures (including critical care time)  Medications Ordered in ED Medications  fentaNYL (SUBLIMAZE) injection 50 mcg (50 mcg Intravenous Given 05/16/18 1146)     Initial Impression / Assessment and Plan / ED Course  I have reviewed the triage vital signs and the nursing notes.  Pertinent labs & imaging results that were available during my care of the patient were reviewed by me and considered in my medical decision making (see chart for details).        Francisco Floyd is a 63 y.o. male with a past medical history is never hepatitis C, cirrhosis, and recent fall with 7 rib fractures status post hemothorax and  chest tube management discharged last week who presents at the direction of his surgical team for further evaluation of his chest.  Patient reports that since discharge she has continued to have some shortness of breath, especially with exertion.  He denies worsening chest pain at this time.  Reports his right-sided chest pain is a 5 out of 10 and well managed.  He denies new cough, fevers, or chills.  Denies nausea, vomiting, urinary symptoms or GI symptoms.  He has not passed out.  He reports that he went to see his surgeon in follow-up yesterday and had a CT scan which "was concerning".  Is unsure of the exact details but his surgeon told him to come to the emergency department for evaluation last night.  As patient was not feeling worsen, he waited till today to come in.  He reports that he is still having shortness of breath and right-sided chest pain.  On exam, patient has tenderness on his right lateral chest wall.  Breath sounds were decreased on the right with some crackles and rhonchi.  Left side unremarkable.  Abdomen nontender.  Patient has some bruising.  Postsurgical area does not appear erythematous or draining or infected.  Patient otherwise appears well.  Cannot see patient's recent CT scan yesterday in the chart.  Will obtain chest x-ray and touch base with his surgical team for further management.  Patient's oxygen saturations were in the low 90s on room air at rest.  Anticipate following up on surgical recommendations.  11:43 AM Chest x-ray shows persistent right large pleural effusion unchanged yesterday.  Images were found in PACS to see the CT yesterday.  General surgery was called who will come see and admit patient for further management.  Patient requested pain medicine and this was ordered.   Final Clinical Impressions(s) / ED Diagnoses   Final diagnoses:  Pleural effusion  Shortness of breath  Right-sided chest pain    ED Discharge Orders    None     Clinical  Impression: 1. Pleural effusion   2. Shortness of breath   3. Right-sided chest pain     Disposition: Admit  This note was prepared with assistance of Dragon voice recognition software. Occasional wrong-word or sound-a-like substitutions may have occurred due to the inherent limitations of voice recognition software.  Tegeler, Canary Brim, MD 05/16/18 1728

## 2018-05-16 NOTE — H&P (Signed)
[facility] Admission Note  Francisco Floyd February 04, 1956  [id number].    Requesting MD: Lynden Oxford MD Chief Complaint/Reason for Consult: pleural effusion  HPI:  Francisco Floyd is a 63yo male PMH hepatitis C, cirrhosis, and chronic pain on oxycodone 20mg  q6hr, who presented to [facility] earlier today for evaluation of worsening right pleural effusion. Patient was admitted to [facility] 1/27 through 2/4 after falling out of a tree and suffering multiple right rib fractures and a right hemopneumothorax requiring chest tube placement. Patient was seen in our clinic yesterday where he was still complaining of intermittent chest pain from rib fractures and shortness of breath. Denies fever, chills, cough. States that he has been able to ambulate around [place], but has worsening shortness of breath with prolonged walking and at night. Patient was sent for a CT chest yesterday which showed a worsening large right pleural effusion. Patient was advised to go to the ED.   ROS: Review of Systems  Constitutional: Negative.   HENT: Negative.   Eyes: Negative.   Respiratory: Positive for shortness of breath. Negative for cough, sputum production and wheezing.   Cardiovascular: Negative.   Gastrointestinal: Negative.   Genitourinary: Negative.   Musculoskeletal: Positive for back pain and joint pain.  Skin: Negative.   Neurological: Negative.    All systems reviewed and otherwise negative except for as above  No family history on file.  Past Medical History:  Diagnosis Date  . Cirrhosis of liver (HCC)   . Hepatitis C   . Rib fractures     Past Surgical History:  Procedure Laterality Date  . INCISION AND DRAINAGE ABSCESS     groin  . TONSILLECTOMY      Social History:  reports that he quit smoking about 20 years ago. His smoking use included cigarettes. He has never used smokeless tobacco. He reports current alcohol use. He reports that he does not use  drugs.  Allergies:  Allergies  Allergen Reactions  . Flagyl [Metronidazole Hcl]     Unknown to family.    (Not in a hospital admission)   Prior to Admission medications   Medication Sig Start Date End Date Taking? Authorizing Provider  gabapentin (NEURONTIN) 600 MG tablet Take 1 tablet (600 mg total) by mouth 3 (three) times daily. 05/04/18  Yes Rayburn, Alphonsus Sias, PA-C  ibuprofen (ADVIL,MOTRIN) 400 MG tablet Take 2 tablets (800 mg total) by mouth 3 (three) times daily. Patient taking differently: Take 800 mg by mouth every 8 (eight) hours as needed for moderate pain.  05/04/18  Yes Rayburn, Alphonsus Sias, PA-C  methocarbamol (ROBAXIN) 500 MG tablet Take 2 tablets (1,000 mg total) by mouth 4 (four) times daily. 05/04/18  Yes Rayburn, Tresa Endo A, PA-C  OVER THE COUNTER MEDICATION Take 1 capsule by mouth daily. Kratom supplement for pain relief   Yes [provider]  Oxycodone HCl 20 MG TABS Take 20 mg by mouth every 6 (six) hours as needed (severe pain).   Yes [provider]  oxymetazoline (AFRIN) 0.05 % nasal spray Place 1 spray into both nostrils 2 (two) times daily as needed for congestion.   Yes [provider]  senna-docusate (SENOKOT-S) 8.6-50 MG tablet Take 1 tablet by mouth 2 (two) times daily. 05/04/18  Yes Rayburn, Alphonsus Sias, PA-C  traMADol (ULTRAM) 50 MG tablet Take 1 tablet (50 mg total) by mouth every 6 (six) hours as needed (mild pain). 05/04/18  Yes Rayburn, Tresa Endo A, PA-C  bisacodyl (DULCOLAX) 10 MG suppository  Place 1 suppository (10 mg total) rectally daily as needed for mild constipation. Patient not taking: Reported on 05/16/2018 05/04/18   Rayburn, Tresa Endo A, PA-C    Blood pressure (!) 141/84, pulse 83, temperature 97.7 F (36.5 C), temperature source Oral, resp. rate (!) 26, height 5\' 7"  (1.702 m), weight 100.7 kg, SpO2 93 %. Physical Exam: General: pleasant, WD/WN white male who is sitting up in bed in NAD HEENT: head is normocephalic, atraumatic.  Sclera are  noninjected.  Pupils equal and round.  Ears and nose without any masses or lesions.  Mouth is pink and moist. Dentition fair Heart: regular, rate, and rhythm.  No obvious murmurs, gallops, or rubs noted.  Palpable pedal pulses bilaterally Lungs: Respiratory effort nonlabored, O2 sats 98% on room air. Diminished breath sounds right side with mild rhonchi, no wheezing.  Abd: protuberant, soft, NT/ND, +BS, no masses, hernias, or organomegaly MS: all 4 extremities are symmetrical with no cyanosis, clubbing, or edema. Skin: warm and dry with no masses, lesions, or rashes Psych: A&Ox3 with an appropriate affect. Neuro: cranial nerves grossly intact, extremity CSM intact bilaterally, normal speech  No results found for this or any previous visit (from the past 48 hour(s)). Dg Chest 2 View  Result Date: 05/16/2018 CLINICAL DATA:  Increasing right pleural effusion. Multiple recent right rib fractures. EXAM: CHEST - 2 VIEW COMPARISON:  Chest CTs dated 05/15/2018 and 05/14/2018 and chest x-ray dated 05/14/2018 FINDINGS: There is a persistent large loculated right pleural effusion with almost complete atelectasis of the right lower lobe with compressive atelectasis of some of the right upper lobe. On the lateral view there appears to be slight decrease in the effusion anteriorly as compared to the prior chest x-ray but unchanged since the prior CT of 05/15/2018. There multiple angulated and slightly displaced right posterolateral rib fractures as previously noted. Heart size and vascularity are normal. Minimal linear scarring or atelectasis at the left lung base. IMPRESSION: Persistent large right pleural effusion, unchanged since yesterday. Persistent almost complete atelectasis of the right upper lobe with partial atelectasis of the right upper lobe due to the extensive loculated effusion. Electronically Signed   By: Francene Boyers M.D.   On: 05/16/2018 11:25      Assessment/Plan Hepatitis  C Cirrhosis Chronic pain - oxycodone 20mg  q6hr at home  Right pleural effusion - Patient with worsening right pleural effusion after falling out of a tree and suffering multiple right rib fractures and a right hemopneumothorax requiring chest tube placement 1/27. Will readmit to med-surg and ask TCTS to see for possible VATS.  ID - none VTE - SCDs, lovenox FEN - IVF, reg diet, NPO after midnight Foley - none Follow up - TBD  Franne Forts, [facility] 05/16/2018, 12:22 PM Pager: [phone] Mon-Thurs 7:00 am-4:30 pm Fri 7:00 am -11:30 AM Sat-Sun 7:00 am-11:30 am

## 2018-05-16 NOTE — Consult Note (Addendum)
Reason for Consult: Loculated right pleural effusion Referring Physician: Violeta Gelinas, MD  Francisco Floyd is an 63 y.o. male.  HPI: The patient is a 63 year old male who was recently hospitalized at [facility] from 04/23/2018 till  05/11/2018 after falling out of a tree and suffering multiple right rib fractures and a right hemopneumothorax requiring chest tube placement.  The patient was seen in the trauma clinic yesterday and was complaining of intermittent chest pain from the rib fractures and shortness of breath.  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  A CT scan of the chest was obtained and showed a worsening large right pleural effusion which was complex in nature.  This study is not in epic.  Chest x-ray does show a right-sided effusion with atelectatic changes.  We are asked to see the patient in thoracic surgical consultation for possible surgical intervention.  Past Medical History:  Diagnosis Date  . Cirrhosis of liver (HCC)   . Hepatitis C   . Rib fractures     Past Surgical History:  Procedure Laterality Date  . INCISION AND DRAINAGE ABSCESS     groin  . TONSILLECTOMY      No family history on file.  Social History:  reports that he quit smoking about 20 years ago. His smoking use included cigarettes. He has never used smokeless tobacco. He reports current alcohol use. He reports that he does not use drugs.  Allergies:  Allergies  Allergen Reactions  . Flagyl [Metronidazole Hcl]     Unknown to family.    Medications:  Current Meds  Medication Sig  . Oxycodone HCl 20 MG TABS Take 20 mg by mouth every 6 (six) hours as needed (severe pain).  Marland Kitchen senna-docusate (SENOKOT-S) 8.6-50 MG tablet Take 1 tablet by mouth 2 (two) times daily.  . [DISCONTINUED] gabapentin (NEURONTIN) 600 MG tablet Take 1 tablet (600 mg total) by mouth 3 (three) times daily.   . [DISCONTINUED] ibuprofen (ADVIL,MOTRIN) 400 MG tablet Take 2 tablets (800 mg total) by mouth 3 (three) times daily. (Patient taking differently: Take 800 mg by mouth every 8 (eight) hours as needed for moderate pain. )  . [DISCONTINUED] methocarbamol (ROBAXIN) 500 MG tablet Take 2 tablets (1,000 mg total) by mouth 4 (four) times daily.  . [DISCONTINUED] OVER THE COUNTER MEDICATION Take 1 capsule by mouth daily. Kratom supplement for pain relief  . [DISCONTINUED] oxymetazoline (AFRIN) 0.05 % nasal spray Place 1 spray into both nostrils 2 (two) times daily as needed for congestion.  . [DISCONTINUED] traMADol (ULTRAM) 50 MG tablet Take 1 tablet (50 mg total) by mouth every 6 (six) hours as needed (mild pain).    Results for orders placed or performed during the hospital encounter of 05/16/18 (from the past 48 hour(s))  CBC     Status: Abnormal   Collection Time: 05/16/18 12:41 PM  Result Value Ref Range   WBC 5.2 4.0 - 10.5 K/uL   RBC 3.74 (L) 4.22 - 5.81 MIL/uL   Hemoglobin 11.0 (L) 13.0 - 17.0 g/dL   HCT 28.4 (L) 13.2 - 44.0 %   MCV 93.0 80.0 - 100.0 fL   MCH 29.4 26.0 - 34.0 pg   MCHC 31.6 30.0 - 36.0 g/dL   RDW 10.2 72.5 - 36.6 %   Platelets 231 150 - 400 K/uL   nRBC 0.0 0.0 - 0.2 %  Comment: Performed at [facility], [address], [place], [place] [address]  Basic metabolic panel     Status: Abnormal   Collection Time: 05/16/18 12:41 PM  Result Value Ref Range   Sodium 139 135 - 145 mmol/L   Potassium 3.9 3.5 - 5.1 mmol/L   Chloride 105 98 - 111 mmol/L   CO2 27 22 - 32 mmol/L   Glucose, Bld 96 70 - 99 mg/dL   BUN 5 (L) 8 - 23 mg/dL   Creatinine, Ser 6.04 0.61 - 1.24 mg/dL   Calcium 8.3 (L) 8.9 - 10.3 mg/dL   GFR calc non Af Amer >60 >60 mL/min   GFR calc Af Amer >60 >60 mL/min   Anion gap 7 5 - 15    Comment: Performed at [facility], [address], [place], [place] [address]    Dg Chest 2 View  Result Date: 05/16/2018 CLINICAL DATA:  Increasing right  pleural effusion. Multiple recent right rib fractures. EXAM: CHEST - 2 VIEW COMPARISON:  Chest CTs dated 05/15/2018 and 05/14/2018 and chest x-ray dated 05/14/2018 FINDINGS: There is a persistent large loculated right pleural effusion with almost complete atelectasis of the right lower lobe with compressive atelectasis of some of the right upper lobe. On the lateral view there appears to be slight decrease in the effusion anteriorly as compared to the prior chest x-ray but unchanged since the prior CT of 05/15/2018. There multiple angulated and slightly displaced right posterolateral rib fractures as previously noted. Heart size and vascularity are normal. Minimal linear scarring or atelectasis at the left lung base. IMPRESSION: Persistent large right pleural effusion, unchanged since yesterday. Persistent almost complete atelectasis of the right upper lobe with partial atelectasis of the right upper lobe due to the extensive loculated effusion. Electronically Signed   By: Francene Boyers M.D.   On: 05/16/2018 11:25    Review of Systems  Constitutional: Positive for diaphoresis. Negative for chills, fever, malaise/fatigue and weight loss.  HENT: Negative.   Eyes: Negative.   Respiratory: Positive for cough, sputum production and shortness of breath.   Cardiovascular: Positive for chest pain and palpitations. Negative for orthopnea, claudication, leg swelling and PND.  Gastrointestinal: Positive for constipation and nausea. Negative for blood in stool, diarrhea, melena and vomiting.  Genitourinary: Negative.   Musculoskeletal: Positive for back pain and neck pain.  Skin: Negative for itching and rash.  Neurological: Positive for dizziness. Negative for tingling, tremors, sensory change, speech change, focal weakness, seizures, loss of consciousness, weakness and headaches.  Endo/Heme/Allergies: Negative.   Psychiatric/Behavioral: Negative.    Blood pressure (!) 165/107, pulse 76, temperature 97.8 F  (36.6 C), temperature source Oral, resp. rate 18, height 5\' 7"  (1.702 m), weight 100.7 kg, SpO2 97 %. Physical Exam  Constitutional: He is oriented to person, place, and time. He appears well-developed and well-nourished. No distress.  HENT:  Head: Normocephalic and atraumatic.  Mouth/Throat: Oropharynx is clear and moist. No oropharyngeal exudate.  Full dentures  Eyes: Pupils are equal, round, and reactive to light. Conjunctivae and EOM are normal. Right eye exhibits no discharge. Left eye exhibits no discharge. No scleral icterus.  Neck: Normal range of motion. Neck supple. No JVD present. No tracheal deviation present. No thyromegaly present.  Cardiovascular: Normal rate, regular rhythm and intact distal pulses. Exam reveals gallop. Exam reveals no friction rub.  No murmur heard. Respiratory: Effort normal and breath sounds normal. No stridor. No respiratory distress. He has no wheezes. He has  no rales. He exhibits tenderness.  + right sided post rib tenderness, dim BS in right base   GI: Soft. He exhibits no distension and no mass. There is no abdominal tenderness. There is no rebound and no guarding.  Musculoskeletal:        General: No tenderness, deformity or edema.  Neurological: He is alert and oriented to person, place, and time.  Skin: Skin is warm and dry. No rash noted. He is not diaphoretic. No erythema. No pallor.  Bruising right hip region  Psychiatric: He has a normal mood and affect. His behavior is normal. Judgment and thought content normal.    Assessment/Plan: Complex right pleural effusion Plan: Possible video-assisted thoracoscopy for decortication  Rowe ClackWayne E Gold PA-C 05/16/2018, 2:24 PM   Patient seen and examined, agree with above Recent fall with multiple rib fractures and right hemopneumothorax. Discharged last week. Readmitted with increased right pleural effusion. Some shortness of breath and some rib pain as well. His rib pain was not severe, although he is  on chronic narcotics (oxycodone 15 mg Q6) for back pain. Today he is having some right posterior chest wall, paraspinal pain. Rib fractures show some callous formation c/w healing fractures.   He needs a right VATS to drain the loculated pleural effusion. Thoracentesis and bedside CT unlikely to drain all the fluid. I do not think rib plating would be particularly helpful at this point.   I discussed the general nature of the procedure, the need for general anesthesia, the incisions to be used and the use of a drainage tube(s) postoperatively with Mr. Sherrilee Gillesrogdon. We discussed the expected hospital stay, overall recovery and short and long term outcomes. I informed him of the indications, risks, benefits and alternatives. He understands the risks include but are not limited to death, stroke, MI, DVT/PE, bleeding, possible need for transfusion, infections, irregular heart rhythms, as well as other organ system dysfunction including respiratory, renal, or GI complications.  He accepts the risks and agrees to proceed.  Plan OR tomorrow Am  Viviann SpareSteven C. Dorris FetchHendrickson, MD [facility] [phone]

## 2018-05-17 ENCOUNTER — Encounter (HOSPITAL_COMMUNITY)
Admission: EM | Disposition: A | Discharge status: Home / Self Care | Payer: Self-pay | Source: Home / Self Care | Attending: Thoracic Surgery (Cardiothoracic Vascular Surgery)

## 2018-05-17 ENCOUNTER — Inpatient Hospital Stay (HOSPITAL_COMMUNITY): Payer: Medicare HMO | Admitting: Anesthesiology

## 2018-05-17 ENCOUNTER — Encounter (HOSPITAL_COMMUNITY): Payer: Self-pay | Admitting: Anesthesiology

## 2018-05-17 ENCOUNTER — Inpatient Hospital Stay (HOSPITAL_COMMUNITY): Payer: Medicare HMO

## 2018-05-17 DIAGNOSIS — Z9889 Other specified postprocedural states: Secondary | ICD-10-CM

## 2018-05-17 HISTORY — PX: VIDEO ASSISTED THORACOSCOPY: SHX5073

## 2018-05-17 LAB — CBC
HCT: 37.2 % — ABNORMAL LOW (ref 39.0–52.0)
HEMATOCRIT: 34 % — AB (ref 39.0–52.0)
HEMOGLOBIN: 11.2 g/dL — AB (ref 13.0–17.0)
Hemoglobin: 12 g/dL — ABNORMAL LOW (ref 13.0–17.0)
MCH: 29.9 pg (ref 26.0–34.0)
MCH: 29.3 pg (ref 26.0–34.0)
MCHC: 32.9 g/dL (ref 30.0–36.0)
MCHC: 32.3 g/dL (ref 30.0–36.0)
MCV: 90.7 fL (ref 80.0–100.0)
MCV: 91 fL (ref 80.0–100.0)
Platelets: 243 10*3/uL (ref 150–400)
Platelets: 273 10*3/uL (ref 150–400)
RBC: 3.75 MIL/uL — ABNORMAL LOW (ref 4.22–5.81)
RBC: 4.09 MIL/uL — ABNORMAL LOW (ref 4.22–5.81)
RDW: 13.4 % (ref 11.5–15.5)
RDW: 13.3 % (ref 11.5–15.5)
WBC: 6.8 10*3/uL (ref 4.0–10.5)
WBC: 7.5 10*3/uL (ref 4.0–10.5)
nRBC: 0 % (ref 0.0–0.2)
nRBC: 0 % (ref 0.0–0.2)

## 2018-05-17 LAB — COMPREHENSIVE METABOLIC PANEL
ALT: 17 U/L (ref 0–44)
AST: 29 U/L (ref 15–41)
Albumin: 2.9 g/dL — ABNORMAL LOW (ref 3.5–5.0)
Alkaline Phosphatase: 240 U/L — ABNORMAL HIGH (ref 38–126)
Anion gap: 10 (ref 5–15)
BILIRUBIN TOTAL: 1.2 mg/dL (ref 0.3–1.2)
BUN: 6 mg/dL — ABNORMAL LOW (ref 8–23)
CO2: 24 mmol/L (ref 22–32)
Calcium: 8.6 mg/dL — ABNORMAL LOW (ref 8.9–10.3)
Chloride: 103 mmol/L (ref 98–111)
Creatinine, Ser: 0.7 mg/dL (ref 0.61–1.24)
GFR calc Af Amer: >60 mL/min (ref >60)
GFR calc non Af Amer: >60 mL/min (ref >60)
Glucose, Bld: 90 mg/dL (ref 70–99)
POTASSIUM: 3.8 mmol/L (ref 3.5–5.1)
Sodium: 137 mmol/L (ref 135–145)
TOTAL PROTEIN: 6.8 g/dL (ref 6.5–8.1)

## 2018-05-17 LAB — BLOOD GAS, ARTERIAL
Acid-Base Excess: 2.5 mmol/L — ABNORMAL HIGH (ref 0.0–2.0)
Bicarbonate: 26.1 mmol/L (ref 20.0–28.0)
Drawn by: 40415
O2 Saturation: 92.8 %
Patient temperature: 98.6
pCO2 arterial: 37.5 mmHg (ref 32.0–48.0)
pH, Arterial: 7.457 — ABNORMAL HIGH (ref 7.350–7.450)
pO2, Arterial: 64.2 mmHg — ABNORMAL LOW (ref 83.0–108.0)

## 2018-05-17 LAB — BASIC METABOLIC PANEL
Anion gap: 8 (ref 5–15)
BUN: 6 mg/dL — ABNORMAL LOW (ref 8–23)
CHLORIDE: 104 mmol/L (ref 98–111)
CO2: 26 mmol/L (ref 22–32)
CREATININE: 0.69 mg/dL (ref 0.61–1.24)
Calcium: 8.4 mg/dL — ABNORMAL LOW (ref 8.9–10.3)
GFR calc Af Amer: >60 mL/min (ref >60)
GFR calc non Af Amer: >60 mL/min (ref >60)
Glucose, Bld: 92 mg/dL (ref 70–99)
Potassium: 4 mmol/L (ref 3.5–5.1)
Sodium: 138 mmol/L (ref 135–145)

## 2018-05-17 LAB — TYPE AND SCREEN
ABO/RH(D): O POS
Antibody Screen: NEGATIVE

## 2018-05-17 LAB — URINALYSIS, ROUTINE W REFLEX MICROSCOPIC
Bilirubin Urine: NEGATIVE
Glucose, UA: NEGATIVE mg/dL
HGB URINE DIPSTICK: NEGATIVE
Ketones, ur: NEGATIVE mg/dL
Leukocytes,Ua: NEGATIVE
Nitrite: NEGATIVE
Protein, ur: NEGATIVE mg/dL
Specific Gravity, Urine: 1.017 (ref 1.005–1.030)
pH: 7 (ref 5.0–8.0)

## 2018-05-17 LAB — PROTIME-INR
INR: 1.43
Prothrombin Time: 17.3 seconds — ABNORMAL HIGH (ref 11.4–15.2)

## 2018-05-17 LAB — ABO/RH: ABO/RH(D): O POS

## 2018-05-17 LAB — MRSA PCR SCREENING: MRSA by PCR: NEGATIVE

## 2018-05-17 LAB — APTT: aPTT: 55 seconds — ABNORMAL HIGH (ref 24–36)

## 2018-05-17 SURGERY — VIDEO ASSISTED THORACOSCOPY
Anesthesia: General | Site: Chest | Laterality: Right

## 2018-05-17 MED ORDER — DIPHENHYDRAMINE HCL 12.5 MG/5ML PO ELIX
12.5000 mg | ORAL_SOLUTION | Freq: Four times a day (QID) | ORAL | Status: DC | PRN
  Filled 2018-05-17: qty 5

## 2018-05-17 MED ORDER — LACTATED RINGERS IV SOLN
INTRAVENOUS | Status: DC | PRN
  Administered 2018-05-17: 11:00:00 via INTRAVENOUS

## 2018-05-17 MED ORDER — 0.9 % SODIUM CHLORIDE (POUR BTL) OPTIME
TOPICAL | Status: DC | PRN
  Administered 2018-05-17: 2000 mL

## 2018-05-17 MED ORDER — MEPERIDINE HCL 50 MG/ML IJ SOLN: 6.2500 mg | INTRAMUSCULAR | Status: DC | PRN

## 2018-05-17 MED ORDER — PROPOFOL 10 MG/ML IV BOLUS
INTRAVENOUS | Status: DC | PRN
  Administered 2018-05-17: 140 mg via INTRAVENOUS

## 2018-05-17 MED ORDER — SODIUM CHLORIDE 0.9% FLUSH: 9.0000 mL | INTRAVENOUS | Status: DC | PRN

## 2018-05-17 MED ORDER — ONDANSETRON HCL 4 MG/2ML IJ SOLN
INTRAMUSCULAR | Status: AC
  Filled 2018-05-17: qty 2

## 2018-05-17 MED ORDER — ONDANSETRON HCL 4 MG/2ML IJ SOLN
INTRAMUSCULAR | Status: DC | PRN
  Administered 2018-05-17: 4 mg via INTRAVENOUS

## 2018-05-17 MED ORDER — IPRATROPIUM BROMIDE 0.02 % IN SOLN
0.5000 mg | Freq: Three times a day (TID) | RESPIRATORY_TRACT | Status: DC
  Administered 2018-05-18: 0.5 mg via RESPIRATORY_TRACT
  Filled 2018-05-17: qty 2.5

## 2018-05-17 MED ORDER — DEXAMETHASONE SODIUM PHOSPHATE 10 MG/ML IJ SOLN
INTRAMUSCULAR | Status: DC | PRN
  Administered 2018-05-17: 10 mg via INTRAVENOUS

## 2018-05-17 MED ORDER — POTASSIUM CHLORIDE 10 MEQ/50ML IV SOLN: 10.0000 meq | Freq: Every day | INTRAVENOUS | Status: DC | PRN

## 2018-05-17 MED ORDER — LACTATED RINGERS IV SOLN
INTRAVENOUS | Status: DC | PRN
  Administered 2018-05-17: 12:00:00 via INTRAVENOUS

## 2018-05-17 MED ORDER — FENTANYL CITRATE (PF) 250 MCG/5ML IJ SOLN
INTRAMUSCULAR | Status: AC
  Filled 2018-05-17: qty 5

## 2018-05-17 MED ORDER — DIPHENHYDRAMINE HCL 50 MG/ML IJ SOLN: 12.5000 mg | Freq: Four times a day (QID) | INTRAMUSCULAR | Status: DC | PRN

## 2018-05-17 MED ORDER — POLYETHYLENE GLYCOL 3350 17 G PO PACK
17.0000 g | PACK | Freq: Every day | ORAL | Status: DC | PRN
  Administered 2018-05-21: 17 g via ORAL
  Filled 2018-05-17: qty 1

## 2018-05-17 MED ORDER — CEFAZOLIN SODIUM-DEXTROSE 2-4 GM/100ML-% IV SOLN
2.0000 g | Freq: Three times a day (TID) | INTRAVENOUS | Status: AC
  Administered 2018-05-17 – 2018-05-18 (×2): 2 g via INTRAVENOUS
  Filled 2018-05-17 (×2): qty 100

## 2018-05-17 MED ORDER — NALOXONE HCL 0.4 MG/ML IJ SOLN: 0.4000 mg | INTRAMUSCULAR | Status: DC | PRN

## 2018-05-17 MED ORDER — LACTATED RINGERS IV SOLN: INTRAVENOUS | Status: DC

## 2018-05-17 MED ORDER — MIDAZOLAM HCL 2 MG/2ML IJ SOLN
INTRAMUSCULAR | Status: AC
  Filled 2018-05-17: qty 2

## 2018-05-17 MED ORDER — FENTANYL CITRATE (PF) 100 MCG/2ML IJ SOLN
INTRAMUSCULAR | Status: AC
  Filled 2018-05-17: qty 2

## 2018-05-17 MED ORDER — ENOXAPARIN SODIUM 40 MG/0.4ML ~~LOC~~ SOLN
40.0000 mg | Freq: Every day | SUBCUTANEOUS | Status: DC
  Administered 2018-05-18 – 2018-05-22 (×5): 40 mg via SUBCUTANEOUS
  Filled 2018-05-17 (×6): qty 0.4

## 2018-05-17 MED ORDER — HYDROMORPHONE HCL 1 MG/ML IJ SOLN
0.2500 mg | INTRAMUSCULAR | Status: DC | PRN
  Administered 2018-05-17 (×2): 0.5 mg via INTRAVENOUS

## 2018-05-17 MED ORDER — CEFAZOLIN SODIUM-DEXTROSE 2-4 GM/100ML-% IV SOLN
2.0000 g | INTRAVENOUS | Status: AC
  Administered 2018-05-17: 2 g via INTRAVENOUS
  Filled 2018-05-17: qty 100

## 2018-05-17 MED ORDER — LIDOCAINE 2% (20 MG/ML) 5 ML SYRINGE
INTRAMUSCULAR | Status: DC | PRN
  Administered 2018-05-17: 40 mg via INTRAVENOUS

## 2018-05-17 MED ORDER — PROMETHAZINE HCL 25 MG/ML IJ SOLN: 6.2500 mg | INTRAMUSCULAR | Status: DC | PRN

## 2018-05-17 MED ORDER — PROPOFOL 10 MG/ML IV BOLUS
INTRAVENOUS | Status: AC
  Filled 2018-05-17: qty 20

## 2018-05-17 MED ORDER — ACETAMINOPHEN 10 MG/ML IV SOLN: 1000.0000 mg | Freq: Once | INTRAVENOUS | Status: DC | PRN

## 2018-05-17 MED ORDER — ONDANSETRON HCL 4 MG/2ML IJ SOLN: 4.0000 mg | Freq: Four times a day (QID) | INTRAMUSCULAR | Status: DC | PRN

## 2018-05-17 MED ORDER — HYDROMORPHONE HCL 1 MG/ML IJ SOLN
INTRAMUSCULAR | Status: AC
  Filled 2018-05-17: qty 1

## 2018-05-17 MED ORDER — ROCURONIUM BROMIDE 10 MG/ML (PF) SYRINGE
PREFILLED_SYRINGE | INTRAVENOUS | Status: DC | PRN
  Administered 2018-05-17 (×2): 50 mg via INTRAVENOUS

## 2018-05-17 MED ORDER — ACETAMINOPHEN 500 MG PO TABS
1000.0000 mg | ORAL_TABLET | Freq: Four times a day (QID) | ORAL | Status: DC
  Administered 2018-05-17 (×2): 1000 mg via ORAL
  Filled 2018-05-17 (×3): qty 2

## 2018-05-17 MED ORDER — HYDRALAZINE HCL 20 MG/ML IJ SOLN: 10.0000 mg | Freq: Once | INTRAMUSCULAR | Status: DC

## 2018-05-17 MED ORDER — SODIUM CHLORIDE 0.9 % IV SOLN
INTRAVENOUS | Status: DC
  Administered 2018-05-17: 17:00:00 via INTRAVENOUS

## 2018-05-17 MED ORDER — ALBUTEROL SULFATE (2.5 MG/3ML) 0.083% IN NEBU
2.5000 mg | INHALATION_SOLUTION | RESPIRATORY_TRACT | Status: DC
  Administered 2018-05-17: 2.5 mg via RESPIRATORY_TRACT
  Filled 2018-05-17: qty 3

## 2018-05-17 MED ORDER — DEXAMETHASONE SODIUM PHOSPHATE 10 MG/ML IJ SOLN
INTRAMUSCULAR | Status: AC
  Filled 2018-05-17: qty 1

## 2018-05-17 MED ORDER — HYDROMORPHONE HCL 1 MG/ML IJ SOLN
INTRAMUSCULAR | Status: AC
  Filled 2018-05-17: qty 0.5

## 2018-05-17 MED ORDER — LIDOCAINE 2% (20 MG/ML) 5 ML SYRINGE
INTRAMUSCULAR | Status: AC
  Filled 2018-05-17: qty 5

## 2018-05-17 MED ORDER — SENNOSIDES-DOCUSATE SODIUM 8.6-50 MG PO TABS
1.0000 | ORAL_TABLET | Freq: Every day | ORAL | Status: DC
  Administered 2018-05-17 – 2018-05-20 (×4): 1 via ORAL
  Filled 2018-05-17 (×4): qty 1

## 2018-05-17 MED ORDER — HYDRALAZINE HCL 20 MG/ML IJ SOLN
INTRAMUSCULAR | Status: AC
  Filled 2018-05-17: qty 1

## 2018-05-17 MED ORDER — BUPIVACAINE HCL 0.5 % IJ SOLN
INTRAMUSCULAR | Status: AC
  Filled 2018-05-17: qty 1

## 2018-05-17 MED ORDER — BISACODYL 5 MG PO TBEC
10.0000 mg | DELAYED_RELEASE_TABLET | Freq: Every day | ORAL | Status: DC
  Administered 2018-05-17 – 2018-05-21 (×5): 10 mg via ORAL
  Filled 2018-05-17 (×5): qty 2

## 2018-05-17 MED ORDER — MIDAZOLAM HCL 5 MG/5ML IJ SOLN
INTRAMUSCULAR | Status: DC | PRN
  Administered 2018-05-17 (×2): 1 mg via INTRAVENOUS

## 2018-05-17 MED ORDER — ACETAMINOPHEN 160 MG/5ML PO SOLN: 325.0000 mg | Freq: Once | ORAL | Status: DC

## 2018-05-17 MED ORDER — HYDROMORPHONE 1 MG/ML IV SOLN
INTRAVENOUS | Status: DC
  Administered 2018-05-17: 2.1 mg via INTRAVENOUS
  Administered 2018-05-17: 1.8 mg via INTRAVENOUS
  Administered 2018-05-17: 25 mg via INTRAVENOUS
  Administered 2018-05-18: 5.7 mg via INTRAVENOUS
  Administered 2018-05-18: 3.6 mg via INTRAVENOUS
  Administered 2018-05-18: 2.4 mg via INTRAVENOUS
  Administered 2018-05-18: 15:00:00 via INTRAVENOUS
  Administered 2018-05-18: 3.9 mg via INTRAVENOUS
  Filled 2018-05-17 (×3): qty 25

## 2018-05-17 MED ORDER — ACETAMINOPHEN 160 MG/5ML PO SOLN
1000.0000 mg | Freq: Four times a day (QID) | ORAL | Status: DC
  Administered 2018-05-19 (×2): 1000 mg via ORAL
  Filled 2018-05-17 (×3): qty 40.6

## 2018-05-17 MED ORDER — FENTANYL CITRATE (PF) 100 MCG/2ML IJ SOLN
INTRAMUSCULAR | Status: DC | PRN
  Administered 2018-05-17 (×3): 50 ug via INTRAVENOUS
  Administered 2018-05-17: 100 ug via INTRAVENOUS
  Administered 2018-05-17 (×2): 50 ug via INTRAVENOUS

## 2018-05-17 MED ORDER — ACETAMINOPHEN 325 MG PO TABS: 325.0000 mg | ORAL_TABLET | Freq: Once | ORAL | Status: DC

## 2018-05-17 MED ORDER — ROCURONIUM BROMIDE 50 MG/5ML IV SOSY
PREFILLED_SYRINGE | INTRAVENOUS | Status: AC
  Filled 2018-05-17: qty 5

## 2018-05-17 MED ORDER — IPRATROPIUM BROMIDE 0.02 % IN SOLN
0.5000 mg | RESPIRATORY_TRACT | Status: DC
  Administered 2018-05-17: 0.5 mg via RESPIRATORY_TRACT
  Filled 2018-05-17: qty 2.5

## 2018-05-17 MED ORDER — SODIUM CHLORIDE (PF) 0.9 % IJ SOLN
INTRAMUSCULAR | Status: DC | PRN
  Administered 2018-05-17: 50 mL via INTRAVENOUS

## 2018-05-17 MED ORDER — SUGAMMADEX SODIUM 200 MG/2ML IV SOLN
INTRAVENOUS | Status: DC | PRN
  Administered 2018-05-17: 200 mg via INTRAVENOUS

## 2018-05-17 MED ORDER — ALBUTEROL SULFATE (2.5 MG/3ML) 0.083% IN NEBU
2.5000 mg | INHALATION_SOLUTION | Freq: Three times a day (TID) | RESPIRATORY_TRACT | Status: DC
  Administered 2018-05-18: 2.5 mg via RESPIRATORY_TRACT
  Filled 2018-05-17: qty 3

## 2018-05-17 SURGICAL SUPPLY — 77 items
APPLICATOR COTTON TIP 6 STRL (MISCELLANEOUS) IMPLANT
APPLICATOR COTTON TIP 6IN STRL (MISCELLANEOUS) ×3 IMPLANT
APPLIER CLIP ROT 10 11.4 M/L (STAPLE)
CANISTER SUCT 3000ML PPV (MISCELLANEOUS) ×3 IMPLANT
CATH THORACIC 28FR RT ANG (CATHETERS) IMPLANT
CATH THORACIC 36FR (CATHETERS) IMPLANT
CATH THORACIC 36FR RT ANG (CATHETERS) IMPLANT
CLIP APPLIE ROT 10 11.4 M/L (STAPLE) IMPLANT
CLIP VESOCCLUDE MED 24/CT (CLIP) ×2 IMPLANT
CLIP VESOCCLUDE MED 6/CT (CLIP) IMPLANT
CONN ST 1/4X3/8  BEN (MISCELLANEOUS) ×4
CONN ST 1/4X3/8 BEN (MISCELLANEOUS) IMPLANT
CONN Y 3/8X3/8X3/8  BEN (MISCELLANEOUS) ×2
CONN Y 3/8X3/8X3/8 BEN (MISCELLANEOUS) ×1 IMPLANT
CONT SPEC 4OZ CLIKSEAL STRL BL (MISCELLANEOUS) ×12 IMPLANT
COVER SURGICAL LIGHT HANDLE (MISCELLANEOUS) ×3 IMPLANT
COVER WAND RF STERILE (DRAPES) ×3 IMPLANT
CUTTER ECHEON FLEX ENDO 45 340 (ENDOMECHANICALS) ×2 IMPLANT
DERMABOND ADVANCED (GAUZE/BANDAGES/DRESSINGS) ×2
DERMABOND ADVANCED .7 DNX12 (GAUZE/BANDAGES/DRESSINGS) IMPLANT
DRAIN CHANNEL 28F RND 3/8 FF (WOUND CARE) ×4 IMPLANT
DRAIN CHANNEL 32F RND 10.7 FF (WOUND CARE) IMPLANT
DRAPE LAPAROSCOPIC ABDOMINAL (DRAPES) ×3 IMPLANT
DRAPE SLUSH/WARMER DISC (DRAPES) ×3 IMPLANT
ELECT REM PT RETURN 9FT ADLT (ELECTROSURGICAL) ×3
ELECTRODE REM PT RTRN 9FT ADLT (ELECTROSURGICAL) ×1 IMPLANT
GAUZE SPONGE 4X4 12PLY STRL (GAUZE/BANDAGES/DRESSINGS) ×3 IMPLANT
GLOVE SURG SIGNA 7.5 PF LTX (GLOVE) ×6 IMPLANT
GOWN STRL REUS W/ TWL LRG LVL3 (GOWN DISPOSABLE) ×2 IMPLANT
GOWN STRL REUS W/ TWL XL LVL3 (GOWN DISPOSABLE) ×1 IMPLANT
GOWN STRL REUS W/TWL LRG LVL3 (GOWN DISPOSABLE) ×4
GOWN STRL REUS W/TWL XL LVL3 (GOWN DISPOSABLE) ×2
HEMOSTAT SURGICEL 2X14 (HEMOSTASIS) IMPLANT
IV CATH 22GX1 FEP (IV SOLUTION) ×2 IMPLANT
KIT BASIN OR (CUSTOM PROCEDURE TRAY) ×3 IMPLANT
KIT SUCTION CATH 14FR (SUCTIONS) ×3 IMPLANT
KIT TURNOVER KIT B (KITS) ×3 IMPLANT
NS IRRIG 1000ML POUR BTL (IV SOLUTION) ×6 IMPLANT
PACK CHEST (CUSTOM PROCEDURE TRAY) ×3 IMPLANT
PAD ARMBOARD 7.5X6 YLW CONV (MISCELLANEOUS) ×6 IMPLANT
POUCH ENDO CATCH II 15MM (MISCELLANEOUS) IMPLANT
POUCH SPECIMEN RETRIEVAL 10MM (ENDOMECHANICALS) IMPLANT
RELOAD STAPLE 45 GOLD REG/THCK (STAPLE) IMPLANT
SEALANT PROGEL (MISCELLANEOUS) IMPLANT
SEALANT SURG COSEAL 4ML (VASCULAR PRODUCTS) IMPLANT
SEALANT SURG COSEAL 8ML (VASCULAR PRODUCTS) IMPLANT
SOLUTION ANTI FOG 6CC (MISCELLANEOUS) ×3 IMPLANT
SPECIMEN JAR MEDIUM (MISCELLANEOUS) ×1 IMPLANT
SPONGE INTESTINAL PEANUT (DISPOSABLE) ×10 IMPLANT
SPONGE TONSIL TAPE 1 RFD (DISPOSABLE) ×3 IMPLANT
STAPLE RELOAD 45MM GOLD (STAPLE) ×6 IMPLANT
SUT PROLENE 4 0 RB 1 (SUTURE)
SUT PROLENE 4-0 RB1 .5 CRCL 36 (SUTURE) IMPLANT
SUT SILK  1 MH (SUTURE) ×4
SUT SILK 1 MH (SUTURE) ×2 IMPLANT
SUT SILK 2 0SH CR/8 30 (SUTURE) IMPLANT
SUT SILK 3 0SH CR/8 30 (SUTURE) IMPLANT
SUT VIC AB 1 CTX 36 (SUTURE) ×2
SUT VIC AB 1 CTX36XBRD ANBCTR (SUTURE) IMPLANT
SUT VIC AB 2-0 CTX 36 (SUTURE) ×2 IMPLANT
SUT VIC AB 2-0 UR6 27 (SUTURE) IMPLANT
SUT VIC AB 3-0 MH 27 (SUTURE) IMPLANT
SUT VIC AB 3-0 X1 27 (SUTURE) ×3 IMPLANT
SUT VICRYL 2 TP 1 (SUTURE) IMPLANT
SYR 20CC LL (SYRINGE) ×2 IMPLANT
SYSTEM SAHARA CHEST DRAIN ATS (WOUND CARE) ×3 IMPLANT
TAPE CLOTH 4X10 WHT NS (GAUZE/BANDAGES/DRESSINGS) ×3 IMPLANT
TAPE CLOTH SURG 4X10 WHT LF (GAUZE/BANDAGES/DRESSINGS) ×2 IMPLANT
TIP APPLICATOR SPRAY EXTEND 16 (VASCULAR PRODUCTS) IMPLANT
TOWEL GREEN STERILE (TOWEL DISPOSABLE) ×3 IMPLANT
TOWEL GREEN STERILE FF (TOWEL DISPOSABLE) ×3 IMPLANT
TRAY FOLEY MTR SLVR 16FR STAT (SET/KITS/TRAYS/PACK) ×3 IMPLANT
TROCAR XCEL BLADELESS 5X75MML (TROCAR) ×3 IMPLANT
TROCAR XCEL NON-BLD 5MMX100MML (ENDOMECHANICALS) IMPLANT
TUBE CONNECTING 12'X1/4 (SUCTIONS) ×2
TUBE CONNECTING 12X1/4 (SUCTIONS) ×2 IMPLANT
WATER STERILE IRR 1000ML POUR (IV SOLUTION) ×6 IMPLANT

## 2018-05-17 NOTE — Progress Notes (Signed)
Pt NPO midnight for TCTS with VATs, alert and oriented, given pain meds prior to transfer, discontinue telemetry while transport and resume tele in OR, with consent, CHG bath done.

## 2018-05-17 NOTE — Transfer of Care (Signed)
Immediate Anesthesia Transfer of Care Note  Patient: Francisco Floyd  Procedure(s) Performed: RIGHT VIDEO ASSISTED THORACOSCOPY, DRAINAGE OF PLEURAL EFFUSION (Right Chest)  Patient Location: PACU  Anesthesia Type:General  Level of Consciousness: awake, alert  and oriented  Airway & Oxygen Therapy: Patient Spontanous Breathing and Patient connected to face mask  Post-op Assessment: Report given to RN, Post -op Vital signs reviewed and stable and Patient moving all extremities X 4  Post vital signs: Reviewed and stable  Last Vitals:  Vitals Value Taken Time  BP 142/90 05/17/2018  2:22 PM  Temp    Pulse 94 05/17/2018  2:33 PM  Resp 28 05/17/2018  2:33 PM  SpO2 97 % 05/17/2018  2:33 PM  Vitals shown include unvalidated device data.  Last Pain:  Vitals:   05/17/18 0952  TempSrc:   PainSc: 8          Complications: No apparent anesthesia complications

## 2018-05-17 NOTE — Care Management Note (Signed)
Case Management Note  Patient Details  Name: Francisco Floyd MRN: [id number] Date of Birth: [birth year]  Subjective/Objective:  Pt admitted on 05/16/18 with large Rt pleural effusion s/p recent multiple Rt rib fx with chest tube.  He was dc from [facility] on 05/01/18.  PTA, pt independent and living with family members.                   Action/Plan: Pt to OR today for Rt VATS and drainage of pleural effusion.  Will follow for discharge planning as pt progresses.  Expected Discharge Date:                  Expected Discharge Plan:  Home/Self Care  In-House Referral:     Discharge planning Services  CM Consult  Post Acute Care Choice:    Choice offered to:     DME Arranged:    DME Agency:     HH Arranged:    HH Agency:     Status of Service:  In process, will continue to follow  If discussed at Long Length of Stay Meetings, dates discussed:    Additional Comments:  Quintella Baton, RN, BSN  Trauma/Neuro ICU Case Manager [phone]

## 2018-05-17 NOTE — Brief Op Note (Signed)
05/16/2018 - 05/17/2018  2:06 PM  PATIENT:  Francisco Floyd  63 y.o. male  PRE-OPERATIVE DIAGNOSIS:  RIGHT PLEURAL EFFUSION  POST-OPERATIVE DIAGNOSIS:  COMPLEX LOCULATED RIGHT PLEURAL EFFUSION, RIGHT CHEST WALL HEMATOMA  PROCEDURE:  Procedure(s): RIGHT VIDEO ASSISTED THORACOSCOPY, DRAINAGE OF RIGHT PLEURAL EFFUSION AND RIGHT POSTERIOR CHEST WALL HEMATOMA   SURGEON:  Surgeon(s) and Role:    * Loreli Slot, MD - Primary  PHYSICIAN ASSISTANT: Deysha Cartier   ANESTHESIA:   general  EBL:  50 mL   BLOOD ADMINISTERED:none  DRAINS: 3fr BLAKE DRAINS x 2   LOCAL MEDICATIONS USED:  BUPIVICAINE   SPECIMEN:  Source of Specimen:  PLEURAL FLUID, PLEURAL PEEL  DISPOSITION OF SPECIMEN:  PATHOLOGY  COUNTS:  YES  DICTATION: .Dragon Dictation  PLAN OF CARE: Admit to inpatient   PATIENT DISPOSITION:  PACU - hemodynamically stable.   Delay start of Pharmacological VTE agent (>24hrs) due to surgical blood loss or risk of bleeding: no

## 2018-05-17 NOTE — Anesthesia Procedure Notes (Signed)
Procedure Name: Intubation Date/Time: 05/17/2018 12:11 PM Performed by: Kyung Rudd, CRNA Pre-anesthesia Checklist: Patient identified, Emergency Drugs available, Suction available and Patient being monitored Patient Re-evaluated:Patient Re-evaluated prior to induction Oxygen Delivery Method: Circle system utilized Preoxygenation: Pre-oxygenation with 100% oxygen Induction Type: IV induction Ventilation: Mask ventilation without difficulty, Oral airway inserted - appropriate to patient size and Two handed mask ventilation required Laryngoscope Size: Mac and 4 Grade View: Grade I Endobronchial tube: Left, Double lumen EBT, Bronchial Blocker placed under direct vision, EBT position confirmed by auscultation and EBT position confirmed by fiberoptic bronchoscope and 39 Fr Number of attempts: 1 Airway Equipment and Method: Stylet Placement Confirmation: ETT inserted through vocal cords under direct vision,  positive ETCO2 and breath sounds checked- equal and bilateral Tube secured with: Tape Dental Injury: Teeth and Oropharynx as per pre-operative assessment

## 2018-05-17 NOTE — Anesthesia Preprocedure Evaluation (Addendum)
Anesthesia Evaluation  Patient identified by MRN, date of birth, ID band Patient awake    Reviewed: Allergy & Precautions, NPO status , Patient's Chart, lab work & pertinent test results  Airway Mallampati: I  TM Distance: >3 FB Neck ROM: Full    Dental  (+) Edentulous Upper, Edentulous Lower   Pulmonary former smoker,     + decreased breath sounds      Cardiovascular negative cardio ROS   Rhythm:Regular Rate:Normal     Neuro/Psych negative neurological ROS  negative psych ROS   GI/Hepatic negative GI ROS, (+) Cirrhosis       , Hepatitis -, C  Endo/Other  negative endocrine ROS  Renal/GU negative Renal ROS     Musculoskeletal negative musculoskeletal ROS (+)   Abdominal (+) + obese,   Peds  Hematology negative hematology ROS (+)   Anesthesia Other Findings   Reproductive/Obstetrics                            Lab Results  Component Value Date   WBC 7.5 05/17/2018   HGB 12.0 (L) 05/17/2018   HCT 37.2 (L) 05/17/2018   MCV 91.0 05/17/2018   PLT 273 05/17/2018   Lab Results  Component Value Date   CREATININE 0.70 05/17/2018   BUN 6 (L) 05/17/2018   NA 137 05/17/2018   K 3.8 05/17/2018   CL 103 05/17/2018   CO2 24 05/17/2018   Lab Results  Component Value Date   INR 1.43 05/17/2018   INR 1.30 04/23/2018   INR 1.29 12/15/2011   EKG: sinus tachycardia, RBBB.   Anesthesia Physical Anesthesia Plan  ASA: III  Anesthesia Plan: General   Post-op Pain Management:    Induction: Intravenous  PONV Risk Score and Plan: 3 and Ondansetron, Dexamethasone and Midazolam  Airway Management Planned: Double Lumen EBT  Additional Equipment: Arterial line, CVP and Ultrasound Guidance Line Placement  Intra-op Plan:   Post-operative Plan: Extubation in OR  Informed Consent: I have reviewed the patients History and Physical, chart, labs and discussed the procedure including the  risks, benefits and alternatives for the proposed anesthesia with the patient or authorized representative who has indicated his/her understanding and acceptance.       Plan Discussed with: CRNA  Anesthesia Plan Comments:        Anesthesia Quick Evaluation

## 2018-05-17 NOTE — Anesthesia Procedure Notes (Signed)
Central Venous Catheter Insertion Performed by: [name], MD, anesthesiologist Start/End2/20/2020 11:00 AM, 05/17/2018 11:10 AM Patient location: Pre-op. Preanesthetic checklist: patient identified, IV checked, site marked, risks and benefits discussed, surgical consent, monitors and equipment checked, pre-op evaluation, timeout performed and anesthesia consent Position: Trendelenburg Lidocaine 1% used for infiltration and patient sedated Hand hygiene performed , maximum sterile barriers used  and Seldinger technique used Catheter size: 8 Fr Total catheter length 16. Central line was placed.Double lumen Procedure performed using ultrasound guided technique. Ultrasound Notes:anatomy identified, needle tip was noted to be adjacent to the nerve/plexus identified, no ultrasound evidence of intravascular and/or intraneural injection and image(s) printed for medical record Attempts: 1 Following insertion, dressing applied, line sutured and Biopatch. Post procedure assessment: blood return through all ports  Patient tolerated the procedure well with no immediate complications.

## 2018-05-17 NOTE — Anesthesia Procedure Notes (Signed)
Arterial Line Insertion Start/End2/20/2020 11:10 AM, 05/17/2018 11:20 AM Performed by: Quentin Ore, CRNA, CRNA  Patient location: Pre-op. Preanesthetic checklist: patient identified, IV checked, site marked, risks and benefits discussed, surgical consent, monitors and equipment checked, pre-op evaluation and timeout performed Lidocaine 1% used for infiltration and patient sedated Left, radial was placed Catheter size: 20 G Hand hygiene performed , maximum sterile barriers used  and Seldinger technique used Allen's test indicative of satisfactory collateral circulation Attempts: 1 Procedure performed without using ultrasound guided technique. Following insertion, Biopatch and dressing applied. Post procedure assessment: normal  Patient tolerated the procedure well with no immediate complications.

## 2018-05-17 NOTE — Progress Notes (Signed)
[facility] Progress Note  Day of Surgery  Subjective: CC-  Patient states that he had a rough night. Did not sleep well due to back pain.  Denies any current chest pain or shortness of breath, but states that he did wake up once last night feeling SOB.  Objective: Vital signs in last 24 hours: Temp:  [97.7 F (36.5 C)-98.6 F (37 C)] 98.6 F (37 C) (02/20 0624) Pulse Rate:  [76-105] 105 (02/20 0624) Resp:  [8-26] 19 (02/20 0624) BP: (141-171)/(84-107) 167/95 (02/20 0624) SpO2:  [92 %-99 %] 94 % (02/20 0624) Weight:  [100.7 kg] 100.7 kg (02/19 0953) Last BM Date: 05/15/18  Intake/Output from previous day: 02/19 0701 - 02/20 0700 In: 570.5 [P.O.:550; I.V.:20.5] Out: 500 [Urine:500] Intake/Output this shift: No intake/output data recorded.  PE: Gen:  Alert, NAD, pleasant HEENT: EOM's intact, pupils equal and round Card:  RRR Pulm: Respiratory effort nonlabored, O2 sats 92% on room air. Diminished breath sounds right side with mild rhonchi, no wheezing Abd: protuberant, soft, NT/ND, +BS, no masses, hernias, or organomegaly  Ext:  Calves soft and nontender Psych: A&Ox3  Skin: no rashes noted, warm and dry  Lab Results:  Recent Labs    05/17/18 0303 05/17/18 0602  WBC 6.8 7.5  HGB 11.2* 12.0*  HCT 34.0* 37.2*  PLT 243 273   BMET Recent Labs    05/17/18 0303 05/17/18 0602  NA 138 137  K 4.0 3.8  CL 104 103  CO2 26 24  GLUCOSE 92 90  BUN 6* 6*  CREATININE 0.69 0.70  CALCIUM 8.4* 8.6*   PT/INR Recent Labs    05/17/18 0602  LABPROT 17.3*  INR 1.43   CMP     Component Value Date/Time   NA 137 05/17/2018 0602   K 3.8 05/17/2018 0602   CL 103 05/17/2018 0602   CO2 24 05/17/2018 0602   GLUCOSE 90 05/17/2018 0602   BUN 6 (L) 05/17/2018 0602   CREATININE 0.70 05/17/2018 0602   CREATININE 0.68 12/15/2011 1350   CALCIUM 8.6 (L) 05/17/2018 0602   PROT 6.8 05/17/2018 0602   ALBUMIN 2.9 (L) 05/17/2018 0602   AST 29 05/17/2018 0602   ALT  17 05/17/2018 0602   ALKPHOS 240 (H) 05/17/2018 0602   BILITOT 1.2 05/17/2018 0602   GFRNONAA >60 05/17/2018 0602   GFRNONAA >89 12/15/2011 1350   GFRAA >60 05/17/2018 0602   GFRAA >89 12/15/2011 1350   Lipase  No results found for: LIPASE     Studies/Results: Dg Chest 2 View  Result Date: 05/16/2018 CLINICAL DATA:  Increasing right pleural effusion. Multiple recent right rib fractures. EXAM: CHEST - 2 VIEW COMPARISON:  Chest CTs dated 05/15/2018 and 05/14/2018 and chest x-ray dated 05/14/2018 FINDINGS: There is a persistent large loculated right pleural effusion with almost complete atelectasis of the right lower lobe with compressive atelectasis of some of the right upper lobe. On the lateral view there appears to be slight decrease in the effusion anteriorly as compared to the prior chest x-ray but unchanged since the prior CT of 05/15/2018. There multiple angulated and slightly displaced right posterolateral rib fractures as previously noted. Heart size and vascularity are normal. Minimal linear scarring or atelectasis at the left lung base. IMPRESSION: Persistent large right pleural effusion, unchanged since yesterday. Persistent almost complete atelectasis of the right upper lobe with partial atelectasis of the right upper lobe due to the extensive loculated effusion. Electronically Signed   By: Francene Boyers M.D.  On: 05/16/2018 11:25    Anti-infectives: Anti-infectives (From admission, onward)   Start     Dose/Rate Route Frequency Ordered Stop   05/17/18 1100  ceFAZolin (ANCEF) IVPB 2g/100 mL premix     2 g 200 mL/hr over 30 Minutes Intravenous To [facility] 05/17/18 0427 05/18/18 1100       Assessment/Plan Hepatitis C Cirrhosis Chronic pain - oxycodone 20mg  q6hr at home  Right pleural effusion - To OR today with TCTS for VATS.  ID - none VTE - SCDs, hold lovenox for procedure FEN - IVF, NPO Foley - none Follow up - TBD   LOS: 1 day    Franne Forts  , [facility] 05/17/2018, 7:51 AM Pager: [phone] Mon-Thurs 7:00 am-4:30 pm Fri 7:00 am -11:30 AM Sat-Sun 7:00 am-11:30 am

## 2018-05-17 NOTE — Interval H&P Note (Signed)
History and Physical Interval Note:  05/17/2018 11:48 AM  Francisco Floyd  has presented today for surgery, with the diagnosis of RIGHT PLEURAL EFFUSION  The various methods of treatment have been discussed with the patient and family. After consideration of risks, benefits and other options for treatment, the patient has consented to  Procedure(s): RIGHT VIDEO ASSISTED THORACOSCOPY, DRAINAGE OF PLEURAL EFFUSION (Right) as a surgical intervention .  The patient's history has been reviewed, patient examined, no change in status, stable for surgery.  I have reviewed the patient's chart and labs.  Questions were answered to the patient's satisfaction.     Loreli Slot
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
## 2018-05-18 ENCOUNTER — Encounter (HOSPITAL_COMMUNITY): Payer: Self-pay | Admitting: Thoracic Surgery (Cardiothoracic Vascular Surgery)

## 2018-05-18 ENCOUNTER — Inpatient Hospital Stay (HOSPITAL_COMMUNITY): Payer: Medicare HMO

## 2018-05-18 LAB — CBC
HEMATOCRIT: 34.3 % — AB (ref 39.0–52.0)
Hemoglobin: 11.2 g/dL — ABNORMAL LOW (ref 13.0–17.0)
MCH: 29.8 pg (ref 26.0–34.0)
MCHC: 32.7 g/dL (ref 30.0–36.0)
MCV: 91.2 fL (ref 80.0–100.0)
Platelets: 297 10*3/uL (ref 150–400)
RBC: 3.76 MIL/uL — ABNORMAL LOW (ref 4.22–5.81)
RDW: 13.2 % (ref 11.5–15.5)
WBC: 10 10*3/uL (ref 4.0–10.5)
nRBC: 0 % (ref 0.0–0.2)

## 2018-05-18 LAB — BLOOD GAS, ARTERIAL
Acid-base deficit: 1.6 mmol/L (ref 0.0–2.0)
Bicarbonate: 22.2 mmol/L (ref 20.0–28.0)
Drawn by: 347191
FIO2: 0.21
O2 Saturation: 93.5 %
PH ART: 7.426 (ref 7.350–7.450)
Patient temperature: 98.3
pCO2 arterial: 34.2 mmHg (ref 32.0–48.0)
pO2, Arterial: 67 mmHg — ABNORMAL LOW (ref 83.0–108.0)

## 2018-05-18 LAB — BASIC METABOLIC PANEL
Anion gap: 5 (ref 5–15)
BUN: 9 mg/dL (ref 8–23)
CHLORIDE: 106 mmol/L (ref 98–111)
CO2: 26 mmol/L (ref 22–32)
Calcium: 8 mg/dL — ABNORMAL LOW (ref 8.9–10.3)
Creatinine, Ser: 0.66 mg/dL (ref 0.61–1.24)
GFR calc Af Amer: >60 mL/min (ref >60)
GFR calc non Af Amer: >60 mL/min (ref >60)
GLUCOSE: 144 mg/dL — AB (ref 70–99)
Potassium: 4.2 mmol/L (ref 3.5–5.1)
Sodium: 137 mmol/L (ref 135–145)

## 2018-05-18 MED ORDER — OXYCODONE HCL 5 MG PO TABS
20.0000 mg | ORAL_TABLET | ORAL | Status: DC | PRN
  Administered 2018-05-18 – 2018-05-22 (×25): 20 mg via ORAL
  Filled 2018-05-18 (×25): qty 4

## 2018-05-18 MED ORDER — IPRATROPIUM-ALBUTEROL 0.5-2.5 (3) MG/3ML IN SOLN
3.0000 mL | Freq: Three times a day (TID) | RESPIRATORY_TRACT | Status: DC
  Administered 2018-05-18 – 2018-05-19 (×3): 3 mL via RESPIRATORY_TRACT
  Filled 2018-05-18 (×3): qty 3

## 2018-05-18 NOTE — Progress Notes (Addendum)
      [address]       [place] [address]             [phone]      1 Day Post-Op Procedure(s) (LRB): RIGHT VIDEO ASSISTED THORACOSCOPY, DRAINAGE OF PLEURAL EFFUSION (Right)   Subjective:  Patient complains of pain.  He states he is not getting any relief with PCA.  He states he takes chronic pain medication at home.  He denies N/V  Objective: Vital signs in last 24 hours: Temp:  [97.2 F (36.2 C)-98.3 F (36.8 C)] 98.3 F (36.8 C) (02/21 0400) Pulse Rate:  [88-109] 91 (02/21 0400) Cardiac Rhythm: Normal sinus rhythm (02/21 0700) Resp:  [12-29] 13 (02/21 0400) BP: (125-161)/(69-90) 125/69 (02/21 0400) SpO2:  [90 %-99 %] 96 % (02/21 0723) Arterial Line BP: (146-209)/(57-79) 146/57 (02/21 0400)  Intake/Output from previous day: 02/20 0701 - 02/21 0700 In: 2771.5 [P.O.:360; I.V.:2211.5; IV Piggyback:200] Out: 2835 [Urine:900; Blood:75; Chest Tube:360]  General appearance: alert, cooperative and no distress Heart: regular rate and rhythm Lungs: diminished breath sounds bibasilar Abdomen: soft, non-tender; bowel sounds normal; no masses,  no organomegaly Wound: clean and dry  Lab Results: Recent Labs    05/17/18 0602 05/18/18 0432  WBC 7.5 10.0  HGB 12.0* 11.2*  HCT 37.2* 34.3*  PLT 273 297   BMET:  Recent Labs    05/17/18 0602 05/18/18 0432  NA 137 137  K 3.8 4.2  CL 103 106  CO2 24 26  GLUCOSE 90 144*  BUN 6* 9  CREATININE 0.70 0.66  CALCIUM 8.6* 8.0*    PT/INR:  Recent Labs    05/17/18 0602  LABPROT 17.3*  INR 1.43   ABG    Component Value Date/Time   PHART 7.426 05/18/2018 0425   HCO3 22.2 05/18/2018 0425   ACIDBASEDEF 1.6 05/18/2018 0425   O2SAT 93.5 05/18/2018 0425   CBG (last 3)  No results for input(s): GLUCAP in the last 72 hours.  Assessment/Plan: S/P Procedure(s) (LRB): RIGHT VIDEO ASSISTED THORACOSCOPY, DRAINAGE OF PLEURAL EFFUSION (Right)  1. Chest tube- 440 cc output since surgery, no air leak will  place on water seal 2. Pulm- not on oxygen, aggressive pulmonary toilet, CXR pending 3. CV- hemodynamically stable 4. Pain control- PCA is maxed out due to usage, he is not getting  Much relief, uses Oxycodone chronically at home.. will order home regimen of 20 mg every 4 hours as needed for pain 5. D/C Arterial line 6. IV fluids to KVO 7. Lovenox for DVT 8. Dispo- patient stable, pain biggest issues, home medications re-ordered, CXR wasn't ordered, have placed and this remains pending, chest tube output 440 since surgery, no air leak, will place to water seal, continue current care   LOS: 2 days    Lowella Dandy 05/18/2018 Patient seen and examined, agree with above Will leave tubes to suction today CXR being done currently High tolerance to narcotics due to chronic opiods for pain  Viviann Spare C. Dorris Fetch, MD [facility] [phone]

## 2018-05-18 NOTE — Anesthesia Postprocedure Evaluation (Signed)
Anesthesia Post Note  Patient: Francisco Floyd  Procedure(s) Performed: RIGHT VIDEO ASSISTED THORACOSCOPY, DRAINAGE OF PLEURAL EFFUSION (Right Chest)     Patient location during evaluation: PACU Anesthesia Type: General Level of consciousness: awake and alert Pain management: pain level controlled Vital Signs Assessment: post-procedure vital signs reviewed and stable Respiratory status: spontaneous breathing, nonlabored ventilation, respiratory function stable and patient connected to nasal cannula oxygen Cardiovascular status: blood pressure returned to baseline and stable Postop Assessment: no apparent nausea or vomiting Anesthetic complications: no    Last Vitals:  Vitals:   05/18/18 0400 05/18/18 0723  BP: 125/69   Pulse: 91   Resp: 13   Temp: 36.8 C   SpO2: 91% 96%    Last Pain:  Vitals:   05/18/18 0400  TempSrc: Oral  PainSc:                  [name]

## 2018-05-18 NOTE — Progress Notes (Signed)
[facility] Progress Note  1 Day Post-Op  Subjective: CC-  POD#1 from VATS. Overall doing well. States that he slept well last night. Pain currently well controlled. Denies any SOB. RT in room giving him a breathing treatment.  CXR this AM shows improved aeration in RIGHT lung with stable RIGHT thoracostomy tubes and questionable small RIGHT apex pneumothorax.  Objective: Vital signs in last 24 hours: Temp:  [97.2 F (36.2 C)-98.3 F (36.8 C)] 98.3 F (36.8 C) (02/21 0400) Pulse Rate:  [88-109] 91 (02/21 0400) Resp:  [12-29] 13 (02/21 0400) BP: (125-161)/(69-90) 125/69 (02/21 0400) SpO2:  [90 %-99 %] 96 % (02/21 0723) Arterial Line BP: (146-209)/(57-79) 146/57 (02/21 0400) Last BM Date: 05/15/18  Intake/Output from previous day: 02/20 0701 - 02/21 0700 In: 2771.5 [P.O.:360; I.V.:2211.5; IV Piggyback:200] Out: 2835 [Urine:900; Blood:75; Chest Tube:360] Intake/Output this shift: No intake/output data recorded.  PE: Gen:  Alert, NAD, pleasant HEENT: EOM's intact, pupils equal and round Card:  RRR Pulm: Respiratory effort nonlabored, slightly diminished breath sounds right side with mild rhonchi, no wheezing, CT in place with no obvious air leak Abd: protuberant, soft, NT/ND, +BS, no masses, hernias, or organomegaly  Ext:  Calves soft and nontender Psych: A&Ox3  Skin: no rashes noted, warm and dry   Lab Results:  Recent Labs    05/17/18 0602 05/18/18 0432  WBC 7.5 10.0  HGB 12.0* 11.2*  HCT 37.2* 34.3*  PLT 273 297   BMET Recent Labs    05/17/18 0602 05/18/18 0432  NA 137 137  K 3.8 4.2  CL 103 106  CO2 24 26  GLUCOSE 90 144*  BUN 6* 9  CREATININE 0.70 0.66  CALCIUM 8.6* 8.0*   PT/INR Recent Labs    05/17/18 0602  LABPROT 17.3*  INR 1.43   CMP     Component Value Date/Time   NA 137 05/18/2018 0432   K 4.2 05/18/2018 0432   CL 106 05/18/2018 0432   CO2 26 05/18/2018 0432   GLUCOSE 144 (H) 05/18/2018 0432   BUN 9 05/18/2018 0432    CREATININE 0.66 05/18/2018 0432   CREATININE 0.68 12/15/2011 1350   CALCIUM 8.0 (L) 05/18/2018 0432   PROT 6.8 05/17/2018 0602   ALBUMIN 2.9 (L) 05/17/2018 0602   AST 29 05/17/2018 0602   ALT 17 05/17/2018 0602   ALKPHOS 240 (H) 05/17/2018 0602   BILITOT 1.2 05/17/2018 0602   GFRNONAA >60 05/18/2018 0432   GFRNONAA >89 12/15/2011 1350   GFRAA >60 05/18/2018 0432   GFRAA >89 12/15/2011 1350   Lipase  No results found for: LIPASE     Studies/Results: Dg Chest 2 View  Result Date: 05/16/2018 CLINICAL DATA:  Increasing right pleural effusion. Multiple recent right rib fractures. EXAM: CHEST - 2 VIEW COMPARISON:  Chest CTs dated 05/15/2018 and 05/14/2018 and chest x-ray dated 05/14/2018 FINDINGS: There is a persistent large loculated right pleural effusion with almost complete atelectasis of the right lower lobe with compressive atelectasis of some of the right upper lobe. On the lateral view there appears to be slight decrease in the effusion anteriorly as compared to the prior chest x-ray but unchanged since the prior CT of 05/15/2018. There multiple angulated and slightly displaced right posterolateral rib fractures as previously noted. Heart size and vascularity are normal. Minimal linear scarring or atelectasis at the left lung base. IMPRESSION: Persistent large right pleural effusion, unchanged since yesterday. Persistent almost complete atelectasis of the right upper lobe with partial atelectasis of the  right upper lobe due to the extensive loculated effusion. Electronically Signed   By: Francene Boyers M.D.   On: 05/16/2018 11:25   Dg Chest Port 1 View  Result Date: 05/17/2018 CLINICAL DATA:  Postthoracotomy EXAM: PORTABLE CHEST 1 VIEW COMPARISON:  Portable exam 1531 hours compared to 05/16/2018 FINDINGS: RIGHT jugular line with tip projecting over SVC. Pair of RIGHT thoracostomy tubes again identified. Stable heart size mediastinal contours. Scattered atelectasis and decreased  pleural effusion in RIGHT chest. Improved aeration since previous exam. LEFT lung clear. Questionable small RIGHT apical pneumothorax versus artifact. Bones demineralized. IMPRESSION: Improved aeration in RIGHT lung with stable RIGHT thoracostomy tubes and questionable small RIGHT apex pneumothorax. Electronically Signed   By: Ulyses Southward M.D.   On: 05/17/2018 16:00    Anti-infectives: Anti-infectives (From admission, onward)   Start     Dose/Rate Route Frequency Ordered Stop   05/17/18 2030  ceFAZolin (ANCEF) IVPB 2g/100 mL premix     2 g 200 mL/hr over 30 Minutes Intravenous Every 8 hours 05/17/18 1639 05/18/18 0451   05/17/18 1100  ceFAZolin (ANCEF) IVPB 2g/100 mL premix     2 g 200 mL/hr over 30 Minutes Intravenous To [facility] 05/17/18 0427 05/17/18 1240       Assessment/Plan Hepatitis C Cirrhosis Chronic pain - oxycodone 20mg  q6hr at home  Right pleural effusion S/P Right VATS 2/20 Dr. Dorris Fetch - POD #1 - Continue postop management per TCTS. Trauma will sign off, please call us back with any concerns.  ID -none VTE -SCDs, lovenox FEN -reg diet Foley -none Follow up - TCTS   LOS: 2 days    Franne Forts , [facility] 05/18/2018, 7:46 AM Pager: [phone] Mon-Thurs 7:00 am-4:30 pm Fri 7:00 am -11:30 AM Sat-Sun 7:00 am-11:30 am

## 2018-05-18 NOTE — Progress Notes (Signed)
Patient refuses Tylenol states can not take must have Ibuprofen. Verbally advise Dr. Dorris Fetch who advise to discontinue orders. Per verbal read back will discontinue orders for Tylenol.
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
## 2018-05-18 NOTE — Discharge Summary (Addendum)
Physician Discharge Summary       [address]       Francisco Floyd [address]             [id number]    Patient ID: Francisco Floyd MRN: [id number] DOB/AGE: 1955/09/07 63 y.o.  Admit date: 05/16/2018 Discharge date: 05/22/2018  Admission Diagnoses: Loculated, right pleural effusion and chest wall hematoma secondary to trauma  Discharge Diagnoses:  1. S/P right VATS, drain effusion and chest wall hematoma 2. Anemia 3. History of cirrhosis 4. History of Hepatitis C   Procedure (s):  Right video-assisted thoracoscopy, drainage of right pleural effusion and chest wall hematoma by Dr. Dorris Fetch on 05/17/2018.  History of Presenting Illness The patient is a 63 year old male who was recently hospitalized at [facility] from 04/23/2018 till  05/11/2018 after falling out of a tree and suffering multiple right rib fractures and a right hemopneumothorax requiring chest tube placement.  The patient was seen in the trauma clinic yesterday and was complaining of intermittent chest pain from the rib fractures and shortness of breath.  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  A CT scan of the chest was obtained and showed a worsening large right pleural effusion which was complex in nature.  This study is not in epic.  Chest x-ray does show a right-sided effusion with atelectatic changes.  We are asked to see the patient in thoracic surgical consultation for possible surgical intervention. Dr. Dorris Fetch discussed the general nature of the procedure, the need for general anesthesia, the incisions to be used and the use of a drainage tube(s) postoperatively with Francisco Floyd. They  discussed the expected hospital stay, overall recovery and short and long term outcomes. Dr. Dorris Fetch informed him of the indications, risks, benefits and alternatives. He understands the  risks and agrees to proceed with surgery. He underwent a right VATS, drain right pleural effusion and chest wall hematoma on 05/17/2018.  Brief Hospital Course:  The patient remained afebrile and hemodynamically stable. A line and foley were removed early in the post operative course. Chest tube output gradually decreased. Daily chest x rays were obtained and remained stable. All chest tubes were removed by 05/22/18. Follow up CXR showed no PTX.  Patient has a history of chronic pain and was resumed on narcotic regimen as taken prior to surgery. Patient is ambulating on room air. Patient is tolerating a diet and has had a bowel movement. Wounds are clean and dry. Final chest X ray showed No PTX, a small residual right pleural effusion. Patient is felt surgically stable for discharge today.   Latest Vital Signs: Blood pressure (!) 149/85, pulse (!) 105, temperature 98.4 F (36.9 C), temperature source Oral, resp. rate 20, height 5\' 7"  (1.702 m), weight 100.7 kg, SpO2 95 %.  Physical Exam:  General appearance:alert, cooperative and no distress Heart:regular rate and rhythm Lungs:clear to auscultation bilaterally. No air leak, minimal serous CT drainage. CXR stable with no PTX. Abdomen:soft, non-tender; bowel sounds normal Extremities:extremities normal, atraumatic, no cyanosis or edema Wound:clean and dry  Discharge Condition: Stable and discharged to home.  Recent laboratory studies:  Lab Results  Component Value Date   WBC 10.5 05/19/2018   HGB 10.7 (L) 05/19/2018   HCT 33.7 (L) 05/19/2018   MCV 94.4 05/19/2018   PLT 274 05/19/2018   Lab Results  Component Value Date  NA 137 05/19/2018   K 3.7 05/19/2018   CL 102 05/19/2018   CO2 29 05/19/2018   CREATININE 0.68 05/19/2018   GLUCOSE 93 05/19/2018      Diagnostic Studies: Dg Chest 2 View  Result Date: 05/16/2018 CLINICAL DATA:  Increasing right pleural effusion. Multiple recent right rib fractures. EXAM: CHEST - 2  VIEW COMPARISON:  Chest CTs dated 05/15/2018 and 05/14/2018 and chest x-ray dated 05/14/2018 FINDINGS: There is a persistent large loculated right pleural effusion with almost complete atelectasis of the right lower lobe with compressive atelectasis of some of the right upper lobe. On the lateral view there appears to be slight decrease in the effusion anteriorly as compared to the prior chest x-ray but unchanged since the prior CT of 05/15/2018. There multiple angulated and slightly displaced right posterolateral rib fractures as previously noted. Heart size and vascularity are normal. Minimal linear scarring or atelectasis at the left lung base. IMPRESSION: Persistent large right pleural effusion, unchanged since yesterday. Persistent almost complete atelectasis of the right upper lobe with partial atelectasis of the right upper lobe due to the extensive loculated effusion. Electronically Signed   By: Francisco Floyd M.D.   On: 05/16/2018 11:25   Ct Head Wo Contrast  Result Date: 04/23/2018 CLINICAL DATA:  63 year old male with a history of fall from a ladder EXAM: CT HEAD WITHOUT CONTRAST CT CERVICAL SPINE WITHOUT CONTRAST TECHNIQUE: Multidetector CT imaging of the head and cervical spine was performed following the standard protocol without intravenous contrast. Multiplanar CT image reconstructions of the cervical spine were also generated. COMPARISON:  None. FINDINGS: CT HEAD FINDINGS Brain: No acute intracranial hemorrhage. No midline shift or mass effect. Gray-white differentiation maintained. Unremarkable appearance of the ventricular system. Vascular: Vascular Skull: No acute fracture.  No aggressive bone lesion identified. Sinuses/Orbits: Unremarkable appearance of the orbits. Mastoid air cells clear. No middle ear effusion. No significant sinus disease. Other: None CT CERVICAL SPINE FINDINGS Alignment: Craniocervical junction aligned. Anatomic alignment of the cervical elements. No subluxation. Skull  base and vertebrae: No acute fracture at the skullbase. Vertebral body heights relatively maintained. No acute fracture identified. Soft tissues and spinal canal: Subcutaneous/myofacial gas of the right right neck and right chest extending from the inferior aspects of the exam along the musculature nearly to the skull base. No canal hematoma. No adenopathy. No radiopaque metallic foreign body Disc levels:  Disc space narrowing throughout the cervical spine. C2-C3: Mild disc disease without significant canal narrowing or foraminal narrowing. C3-C4: Mild disc disease and uncovertebral joint disease without significant foraminal narrowing or canal narrowing. C4-C5: No significant canal narrowing or foraminal narrowing with mild disc disease. C5-C6: Disc osteophyte complex with posterior disc bulge/protrusion, eccentric to the left appearing to contact the ventral thecal sac. Associated uncovertebral joint disease bilaterally with facet disease contributing to bilateral foraminal narrowing. C6-C7: Posterior disc osteophyte complex with uncovertebral joint disease contributing to mild bilateral foraminal narrowing. No significant bony canal narrowing. C7-T1: No significant canal narrowing or foraminal narrowing. Upper chest: Tiny right apical pneumothorax. Fractures of the posterior right second and third ribs at the articulation. Displaced anterior right first rib fracture. Myofacial/subcutaneous gas at the right chest and right neck. Mixed interstitial and airspace disease at the right lung apex. No left pneumothorax. Other: Mild carotid calcifications. IMPRESSION: Head CT: Negative head CT for acute intracranial abnormality. Cervical CT: No acute fracture or malalignment of the cervical spine. Multilevel degenerative changes, as above. Worst degree of disc disease present at C5-C6 where there  is posterior disc bulge/protrusion, eccentric to the left that may contact the ventral thecal sac. **An incidental finding of  potential clinical significance has been found. Small right pneumothorax, better characterized on contemporaneous chest CT with associated right first, second, third rib fractures and subcutaneous/myofacial emphysema.** These results were called by telephone at the time of interpretation on 04/23/2018 at 3:33 pm to Dr. Blane Ohara. Electronically Signed   By: Gilmer Mor D.O.   On: 04/23/2018 15:34   Ct Chest W Contrast  Result Date: 04/23/2018 CLINICAL DATA:  Chest and abdominal pain after fall off ladder. EXAM: CT CHEST, ABDOMEN, AND PELVIS WITH CONTRAST TECHNIQUE: Multidetector CT imaging of the chest, abdomen and pelvis was performed following the standard protocol during bolus administration of intravenous contrast. CONTRAST:  OMNIPAQUE IOHEXOL 300 MG/ML  SOLN COMPARISON:  CT scan of Jul 29, 2014. FINDINGS: CT CHEST FINDINGS Cardiovascular: Atherosclerosis of thoracic aorta is noted without aneurysm or dissection. Normal cardiac size. No pericardial effusion. Mediastinum/Nodes: No enlarged mediastinal, hilar, or axillary lymph nodes. Thyroid gland, trachea, and esophagus demonstrate no significant findings. Lungs/Pleura: Mild right pneumothorax is noted anteriorly. Mild left basilar subsegmental atelectasis is noted. Probable moderate size right hemothorax is noted with adjacent atelectasis of the right upper and lower lobes. Musculoskeletal: Large amount of subcutaneous emphysema is seen in the right supraclavicular and chest wall regions. Mildly displaced fractures are seen involving the right second, third, fourth, fifth, 6, seventh, eighth, and ninth ribs. CT ABDOMEN PELVIS FINDINGS Hepatobiliary: No gallstones are noted. Nodular hepatic contours are noted concerning for possible hepatic cirrhosis. No biliary dilatation is noted. No focal hepatic lesion is noted. Pancreas: Unremarkable. No pancreatic ductal dilatation or surrounding inflammatory changes. Spleen: Normal in size without focal  abnormality. Adrenals/Urinary Tract: Adrenal glands are unremarkable. Kidneys are normal, without renal calculi, focal lesion, or hydronephrosis. Bladder is unremarkable. Stomach/Bowel: Stomach is within normal limits. Appendix appears normal. No evidence of bowel wall thickening, distention, or inflammatory changes. Vascular/Lymphatic: Aortic atherosclerosis. No enlarged abdominal or pelvic lymph nodes. Collateral veins are seen in the left upper quadrant suggesting portal hypertension. Reproductive: Prostate is unremarkable. Other: No abdominal wall hernia or abnormality. No abdominopelvic ascites. Musculoskeletal: No acute or significant osseous findings. IMPRESSION: Mild right anterior pneumothorax is noted with moderate size right hemothorax. Adjacent subsegmental atelectasis of right upper and lower lobes are noted. Multiple mildly displaced right rib fractures are noted. Large amount of subcutaneous emphysema is seen over right lateral chest wall and supraclavicular region. Critical Value/emergent results were called by telephone at the time of interpretation on 04/23/2018 at 3:37 pm to Dr. Emelda Brothers , who verbally acknowledged these results. Findings consistent with hepatic cirrhosis with enlarged collateral veins suggesting portal hypertension. No traumatic injury is noted in the abdomen or pelvis. Aortic Atherosclerosis (ICD10-I70.0). Electronically Signed   By: Lupita Raider, M.D.   On: 04/23/2018 15:37   Ct Cervical Spine Wo Contrast  Result Date: 04/23/2018 CLINICAL DATA:  63 year old male with a history of fall from a ladder EXAM: CT HEAD WITHOUT CONTRAST CT CERVICAL SPINE WITHOUT CONTRAST TECHNIQUE: Multidetector CT imaging of the head and cervical spine was performed following the standard protocol without intravenous contrast. Multiplanar CT image reconstructions of the cervical spine were also generated. COMPARISON:  None. FINDINGS: CT HEAD FINDINGS Brain: No acute intracranial hemorrhage. No  midline shift or mass effect. Gray-white differentiation maintained. Unremarkable appearance of the ventricular system. Vascular: Vascular Skull: No acute fracture.  No aggressive bone lesion identified. Sinuses/Orbits: Unremarkable appearance  of the orbits. Mastoid air cells clear. No middle ear effusion. No significant sinus disease. Other: None CT CERVICAL SPINE FINDINGS Alignment: Craniocervical junction aligned. Anatomic alignment of the cervical elements. No subluxation. Skull base and vertebrae: No acute fracture at the skullbase. Vertebral body heights relatively maintained. No acute fracture identified. Soft tissues and spinal canal: Subcutaneous/myofacial gas of the right right neck and right chest extending from the inferior aspects of the exam along the musculature nearly to the skull base. No canal hematoma. No adenopathy. No radiopaque metallic foreign body Disc levels:  Disc space narrowing throughout the cervical spine. C2-C3: Mild disc disease without significant canal narrowing or foraminal narrowing. C3-C4: Mild disc disease and uncovertebral joint disease without significant foraminal narrowing or canal narrowing. C4-C5: No significant canal narrowing or foraminal narrowing with mild disc disease. C5-C6: Disc osteophyte complex with posterior disc bulge/protrusion, eccentric to the left appearing to contact the ventral thecal sac. Associated uncovertebral joint disease bilaterally with facet disease contributing to bilateral foraminal narrowing. C6-C7: Posterior disc osteophyte complex with uncovertebral joint disease contributing to mild bilateral foraminal narrowing. No significant bony canal narrowing. C7-T1: No significant canal narrowing or foraminal narrowing. Upper chest: Tiny right apical pneumothorax. Fractures of the posterior right second and third ribs at the articulation. Displaced anterior right first rib fracture. Myofacial/subcutaneous gas at the right chest and right neck. Mixed  interstitial and airspace disease at the right lung apex. No left pneumothorax. Other: Mild carotid calcifications. IMPRESSION: Head CT: Negative head CT for acute intracranial abnormality. Cervical CT: No acute fracture or malalignment of the cervical spine. Multilevel degenerative changes, as above. Worst degree of disc disease present at C5-C6 where there is posterior disc bulge/protrusion, eccentric to the left that may contact the ventral thecal sac. **An incidental finding of potential clinical significance has been found. Small right pneumothorax, better characterized on contemporaneous chest CT with associated right first, second, third rib fractures and subcutaneous/myofacial emphysema.** These results were called by telephone at the time of interpretation on 04/23/2018 at 3:33 pm to Dr. Blane Ohara. Electronically Signed   By: Gilmer Mor D.O.   On: 04/23/2018 15:34   Ct Abdomen Pelvis W Contrast  Result Date: 04/23/2018 CLINICAL DATA:  Chest and abdominal pain after fall off ladder. EXAM: CT CHEST, ABDOMEN, AND PELVIS WITH CONTRAST TECHNIQUE: Multidetector CT imaging of the chest, abdomen and pelvis was performed following the standard protocol during bolus administration of intravenous contrast. CONTRAST:  OMNIPAQUE IOHEXOL 300 MG/ML  SOLN COMPARISON:  CT scan of Jul 29, 2014. FINDINGS: CT CHEST FINDINGS Cardiovascular: Atherosclerosis of thoracic aorta is noted without aneurysm or dissection. Normal cardiac size. No pericardial effusion. Mediastinum/Nodes: No enlarged mediastinal, hilar, or axillary lymph nodes. Thyroid gland, trachea, and esophagus demonstrate no significant findings. Lungs/Pleura: Mild right pneumothorax is noted anteriorly. Mild left basilar subsegmental atelectasis is noted. Probable moderate size right hemothorax is noted with adjacent atelectasis of the right upper and lower lobes. Musculoskeletal: Large amount of subcutaneous emphysema is seen in the right  supraclavicular and chest wall regions. Mildly displaced fractures are seen involving the right second, third, fourth, fifth, 6, seventh, eighth, and ninth ribs. CT ABDOMEN PELVIS FINDINGS Hepatobiliary: No gallstones are noted. Nodular hepatic contours are noted concerning for possible hepatic cirrhosis. No biliary dilatation is noted. No focal hepatic lesion is noted. Pancreas: Unremarkable. No pancreatic ductal dilatation or surrounding inflammatory changes. Spleen: Normal in size without focal abnormality. Adrenals/Urinary Tract: Adrenal glands are unremarkable. Kidneys are normal, without renal calculi,  focal lesion, or hydronephrosis. Bladder is unremarkable. Stomach/Bowel: Stomach is within normal limits. Appendix appears normal. No evidence of bowel wall thickening, distention, or inflammatory changes. Vascular/Lymphatic: Aortic atherosclerosis. No enlarged abdominal or pelvic lymph nodes. Collateral veins are seen in the left upper quadrant suggesting portal hypertension. Reproductive: Prostate is unremarkable. Other: No abdominal wall hernia or abnormality. No abdominopelvic ascites. Musculoskeletal: No acute or significant osseous findings. IMPRESSION: Mild right anterior pneumothorax is noted with moderate size right hemothorax. Adjacent subsegmental atelectasis of right upper and lower lobes are noted. Multiple mildly displaced right rib fractures are noted. Large amount of subcutaneous emphysema is seen over right lateral chest wall and supraclavicular region. Critical Value/emergent results were called by telephone at the time of interpretation on 04/23/2018 at 3:37 pm to Dr. Emelda Brothers , who verbally acknowledged these results. Findings consistent with hepatic cirrhosis with enlarged collateral veins suggesting portal hypertension. No traumatic injury is noted in the abdomen or pelvis. Aortic Atherosclerosis (ICD10-I70.0). Electronically Signed   By: Lupita Raider, M.D.   On: 04/23/2018 15:37   Dg  Pelvis Portable  Result Date: 04/23/2018 CLINICAL DATA:  Fall from tree. EXAM: PORTABLE PELVIS 1-2 VIEWS COMPARISON:  None. FINDINGS: There is no evidence of pelvic fracture or diastasis. No pelvic bone lesions are seen. IMPRESSION: Negative. Electronically Signed   By: Lupita Raider, M.D.   On: 04/23/2018 14:23   Dg Chest Port 1 View  Result Date: 05/22/2018 CLINICAL DATA:  Chest tube removal. EXAM: PORTABLE CHEST 1 VIEW COMPARISON:  Chest x-ray from earlier same day at 7:37 a.m. and chest x-ray dated 05/21/2018. FINDINGS: RIGHT-sided chest tube has been removed. New opacity at the RIGHT lung base, presumably pleural effusion. LEFT lung is clear. No pneumothorax seen. Heart size and mediastinal contours are within normal limits. IMPRESSION: New opacity at the RIGHT lung base, presumably pleural effusion, status post chest tube removal. If pleural effusion, small to moderate in size. No pneumothorax seen. Electronically Signed   By: Bary Richard M.D.   On: 05/22/2018 12:44   Dg Chest Port 1 View  Result Date: 05/22/2018 CLINICAL DATA:  Followup right chest tube and pleural effusion. EXAM: PORTABLE CHEST 1 VIEW COMPARISON:  Yesterday. FINDINGS: A right chest tube remains in place with no pneumothorax seen. Stable minimal linear atelectasis or scarring at the left lung base. Normal sized heart. Mildly tortuous aorta. Diffuse osteopenia. IMPRESSION: 1. No pneumothorax. 2. Stable minimal linear atelectasis or scarring at the left lung base. Electronically Signed   By: Beckie Salts M.D.   On: 05/22/2018 09:25   Dg Chest Port 1 View  Result Date: 05/21/2018 CLINICAL DATA:  Follow-up chest tube removal EXAM: PORTABLE CHEST 1 VIEW COMPARISON:  05/20/2018 FINDINGS: Cardiac shadow is within normal limits. One of the 2 chest tubes has been removed in the interval. No pneumothorax is seen. The more inferior chest tube remains in place. Postsurgical changes on the right are again noted and stable. The left lung  remains clear. No bony abnormality is noted. IMPRESSION: Status post chest tube removal without recurrent pneumothorax. Electronically Signed   By: Alcide Clever M.D.   On: 05/21/2018 08:23   Dg Chest Port 1 View  Result Date: 05/20/2018 CLINICAL DATA:  Pleural effusions. EXAM: PORTABLE CHEST 1 VIEW COMPARISON:  05/19/2018 FINDINGS: 0722 hours. 2 right chest tubes remain in place without evidence for residual pneumothorax. No substantial right pleural effusion. Volume loss right hemithorax similar to prior. Right IJ central line is been removed in  the interval. Minimal atelectasis scarring at the left base is stable. The visualized bony structures of the thorax are intact. Telemetry leads overlie the chest. IMPRESSION: Interval removal of right IJ central line. Otherwise stable exam. Electronically Signed   By: Eric  Mansell M.D.   On: 05/20/2018 13:47   Dg CheKennith Centerst Port 1 View  Result Date: 05/19/2018 CLINICAL DATA:  Pleural effusion EXAM: PORTABLE CHEST 1 VIEW COMPARISON:  05/18/2018 FINDINGS: Two chest tubes on the right are in place. No pneumothorax. Small right effusion unchanged with mild right lower lobe airspace disease Right jugular central venous catheter tip in the SVC unchanged. Mild left lower lobe atelectasis unchanged. IMPRESSION: No pneumothorax on the right.  No significant change from yesterday. Electronically Signed   By: Marlan Palauharles  Clark M.D.   On: 05/19/2018 09:23   Dg Chest Port 1 View  Result Date: 05/18/2018 CLINICAL DATA:  RIGHT chest tubes in place draining a previously identified loculated pleural effusion/hemothorax. EXAM: PORTABLE CHEST 1 VIEW COMPARISON:  05/17/2018 and earlier, including CT chest 05/15/2018 and 04/23/2018. FINDINGS: Two RIGHT chest tubes in place with no pneumothorax and stable small residual RIGHT pleural effusion. RIGHT jugular central venous catheter tip projects over the mid SVC, unchanged. Atelectasis involving the RIGHT lung base, unchanged. Minimal  airspace consolidation involving the LEFT lung base. Minimal linear atelectasis in the lingula. Lungs otherwise clear. Cardiac silhouette normal in size, unchanged. Normal pulmonary vascularity. IMPRESSION: 1. Two RIGHT chest tubes in place with no pneumothorax. Stable small RIGHT pleural effusion and associated passive atelectasis versus pneumonia in the LEFT LOWER LOBE. 2. Minimal LEFT basilar atelectasis versus pneumonia. Electronically Signed   By: Hulan Saashomas  Lawrence M.D.   On: 05/18/2018 08:44   Dg Chest Port 1 View  Result Date: 05/17/2018 CLINICAL DATA:  Postthoracotomy EXAM: PORTABLE CHEST 1 VIEW COMPARISON:  Portable exam 1531 hours compared to 05/16/2018 FINDINGS: RIGHT jugular line with tip projecting over SVC. Pair of RIGHT thoracostomy tubes again identified. Stable heart size mediastinal contours. Scattered atelectasis and decreased pleural effusion in RIGHT chest. Improved aeration since previous exam. LEFT lung clear. Questionable small RIGHT apical pneumothorax versus artifact. Bones demineralized. IMPRESSION: Improved aeration in RIGHT lung with stable RIGHT thoracostomy tubes and questionable small RIGHT apex pneumothorax. Electronically Signed   By: Ulyses SouthwardMark  Boles M.D.   On: 05/17/2018 16:00   Dg Chest Port 1 View  Result Date: 05/04/2018 CLINICAL DATA:  Right hemothorax. EXAM: PORTABLE CHEST 1 VIEW COMPARISON:  05/04/2018 at 0610 hours FINDINGS: The right-sided pigtail chest tube has been removed. The cardiomediastinal silhouette is unchanged with normal heart size. Small residual right pleural effusion or hemothorax is unchanged with mild right lung atelectasis again noted. There is at most minimal atelectasis in the left lung base. No pneumothorax is identified. Multiple right rib fractures are again noted. There is a small amount of subcutaneous emphysema in the lower right lateral chest wall. IMPRESSION: 1. Interval removal of right-sided chest tube. No pneumothorax. 2. Unchanged small  right pleural effusion/hemothorax and mild atelectasis. Electronically Signed   By: Sebastian AcheAllen  Grady M.D.   On: 05/04/2018 14:29   Dg Chest Port 1 View  Result Date: 05/04/2018 CLINICAL DATA:  Followup right hemothorax and chest tube. EXAM: PORTABLE CHEST 1 VIEW COMPARISON:  05/02/2018 and multiple previous FINDINGS: Right pigtail chest tube unchanged with its tip near the apex. No pneumothorax. Small amount of pleural density does persist consistent with mild residual pleural blood/fluid. Mild atelectasis on the right. Multiple right rib fractures appear the  same. Left lung is clear except for minimal atelectasis at the base. IMPRESSION: No change. Chest tube remains on the right. No pneumothorax. Small amount of residual pleural density. Mild atelectasis. Right rib fractures. Electronically Signed   By: Paulina Fusi M.D.   On: 05/04/2018 07:42   Dg Chest Port 1 View  Result Date: 05/02/2018 CLINICAL DATA:  Right-sided hemothorax EXAM: PORTABLE CHEST 1 VIEW COMPARISON:  Yesterday FINDINGS: Right apical chest tube in stable position. Mild hazy density and volume loss on the right where there are numerous rib fractures. No visible pneumothorax. Stable right chest wall emphysema. Mild atelectasis on the left. Normal heart size. IMPRESSION: 1. Stable chest tube positioning with no evidence of pneumothorax or increasing pleural fluid. 2. Atelectasis. Electronically Signed   By: Marnee Spring M.D.   On: 05/02/2018 09:21   Dg Chest Port 1 View  Result Date: 05/01/2018 CLINICAL DATA:  Right-sided hemothorax EXAM: PORTABLE CHEST 1 VIEW COMPARISON:  04/30/18 FINDINGS: Cardiac shadows within normal limits. Linear bibasilar densities are noted bilaterally stable from previous exam consistent with underlying atelectasis/scarring. Right-sided pigtail chest tube is again noted and stable. No pneumothorax is seen. Multiple right rib fractures are noted. No acute infiltrate is seen. IMPRESSION: No evidence of pneumothorax.  Stable atelectasis/scarring in the bases bilaterally. Electronically Signed   By: Alcide Clever M.D.   On: 05/01/2018 08:44   Dg Chest Port 1 View  Result Date: 04/30/2018 CLINICAL DATA:  Right midthorax follow-up. EXAM: PORTABLE CHEST 1 VIEW COMPARISON:  Chest x-ray from yesterday. FINDINGS: Unchanged right apical pigtail chest tube. The heart size and mediastinal contours are within normal limits. Normal pulmonary vascularity. Unchanged mild bibasilar atelectasis/scarring. No focal consolidation, pleural effusion, or pneumothorax. Unchanged right-sided rib fractures. IMPRESSION: 1. Stable right apical pigtail chest tube.  No pneumothorax. 2. Unchanged mild bibasilar atelectasis/scarring. Electronically Signed   By: Obie Dredge M.D.   On: 04/30/2018 08:14   Dg Chest Port 1 View  Result Date: 04/29/2018 CLINICAL DATA:  Chest tube in place. EXAM: PORTABLE CHEST 1 VIEW COMPARISON:  Radiograph of April 28, 2018. FINDINGS: The heart size and mediastinal contours are within normal limits. Right-sided chest tube is unchanged in position. No definite pneumothorax is noted. Stable right-sided rib fractures are noted with overlying subcutaneous emphysema. Mild bibasilar subsegmental atelectasis is noted. IMPRESSION: Stable position of right-sided chest tube without definite pneumothorax seen. Mild bibasilar subsegmental atelectasis is noted with overlying subcutaneous emphysema. Mild bibasilar subsegmental atelectasis. Electronically Signed   By: Lupita Raider, M.D.   On: 04/29/2018 07:50   Dg Chest Port 1 View  Result Date: 04/28/2018 CLINICAL DATA:  Right-sided rib fractures and hemothorax follow-up. EXAM: PORTABLE CHEST 1 VIEW COMPARISON:  Chest x-ray from yesterday. FINDINGS: Unchanged right apical pigtail chest tube. The heart size and mediastinal contours are within normal limits. Normal pulmonary vascularity. Unchanged right basilar atelectasis. No consolidation, pneumothorax, or large pleural  effusion. Unchanged right-sided rib fractures. Decreasing right chest wall subcutaneous emphysema. IMPRESSION: 1. Stable chest. Unchanged right apical chest tube and right basilar atelectasis. Electronically Signed   By: Obie Dredge M.D.   On: 04/28/2018 09:07   Dg Chest Port 1 View  Result Date: 04/27/2018 CLINICAL DATA:  Pneumothorax EXAM: PORTABLE CHEST 1 VIEW COMPARISON:  April 26, 2018 FINDINGS: The heart size and mediastinal contours are stable. Right apical chest tube is identified unchanged. There is no pneumothorax noted. Multiple right rib fractures are unchanged. Right subcutaneous emphysema is identified. IMPRESSION: Right apical chest tube  is identified unchanged. There is no pneumothorax noted. Electronically Signed   By: Sherian ReinWei-Chen  Lin M.D.   On: 04/27/2018 10:37   Dg Chest Port 1 View  Result Date: 04/26/2018 CLINICAL DATA:  Follow-up hemothorax EXAM: PORTABLE CHEST 1 VIEW COMPARISON:  04/25/2018 FINDINGS: Cardiac shadow is stable. Right apical chest tube is noted without evidence of pneumothorax. Multiple right-sided rib fractures are again noted. No focal infiltrate or sizable effusion is seen. IMPRESSION: No pneumothorax identified.  Chest tube remains in place. Multiple right-sided rib fractures are again seen Electronically Signed   By: Alcide CleverMark  Lukens M.D.   On: 04/26/2018 08:15   Dg Chest Port 1 View  Result Date: 04/25/2018 CLINICAL DATA:  Rib fractures.  Fall.  Hemo pneumothorax. EXAM: PORTABLE CHEST 1 VIEW COMPARISON:  04/24/2018. FINDINGS: RIGHT pigtail catheter good position. No visible pneumothorax. Unchanged RIGHT-sided rib fractures. Stable subcutaneous emphysema. IMPRESSION: Stable RIGHT chest tube without pneumothorax. Electronically Signed   By: Elsie StainJohn T Curnes M.D.   On: 04/25/2018 06:37   Dg Chest Port 1 View  Result Date: 04/24/2018 CLINICAL DATA:  Hemopneumothorax EXAM: PORTABLE CHEST 1 VIEW COMPARISON:  04/23/2018 FINDINGS: Stable right chest tube. No  pneumothorax. Emphysema in the right supraclavicular and right axillary regions is stable. Lungs are under aerated with bibasilar atelectasis. No pleural effusion. Right rib fractures are again noted. IMPRESSION: Stable right chest tube without pneumothorax. Electronically Signed   By: Jolaine ClickArthur  Hoss M.D.   On: 04/24/2018 07:57   Dg Chest Port 1 View  Result Date: 04/23/2018 CLINICAL DATA:  Right pneumothorax and multiple right rib fractures secondary to a fall from a tree today. Chest tube insertion. EXAM: PORTABLE CHEST 1 VIEW 4:16 p.m. COMPARISON:  Chest x-rays dated 04/23/2018 2:08 p.m. and 02/25/2016 and chest CT dated 04/23/2018 FINDINGS: Right-sided chest tube has been inserted. No visible right pneumothorax. Subcutaneous emphysema in the right supraclavicular region and right lateral chest wall is essentially unchanged. There are multiple right lateral rib fractures. In addition, review of the prior chest CT demonstrates nondisplaced fractures of the right transverse processes of T3 through T11. Haziness superior medially on the right and at the inferior aspect of the right hilum extending into the right lung base probably represent a combination of atelectasis with possible lung contusion. Minimal atelectasis at the left lung base laterally. Heart size and vascularity are normal. IMPRESSION: 1. Right chest tube in place with no visible residual pneumothorax. 2. Hazy areas of atelectasis or lung contusion in the right lung as described. 3. Stable subcutaneous emphysema on the right. 4. Multiple right lateral rib fractures. 5. Review of the recent CT scan also demonstrates multiple right thoracic transverse process fractures. Electronically Signed   By: Francisco BoyersJames  Maxwell M.D.   On: 04/23/2018 16:44   Dg Chest Port 1 View  Result Date: 04/23/2018 CLINICAL DATA:  Trauma, fall from tree. EXAM: PORTABLE CHEST 1 VIEW COMPARISON:  Chest radiograph February 25, 2016 FINDINGS: Acute appearing RIGHT third through  ninth rib fractures. Patchy RIGHT lower lung zone airspace opacity. Mild chronic interstitial changes without pleural effusion. No pneumothorax. Cardiomediastinal silhouette is normal. Calcified aortic arch. RIGHT chest wall subcutaneous gas tracking into neck. IMPRESSION: 1. Acute RIGHT third through ninth rib fractures. No identified pneumothorax though CT would be more sensitive. RIGHT chest wall subcutaneous emphysema. 2. Increasing constant acuity of RIGHT lung base opacity, possible contusion. 3. Acute findings discussed with and reconfirmed by Dr.JOSHUA ZAVITZ on 04/23/2018 at 2:26 pm. Electronically Signed   By: Awilda Metroourtnay  Bloomer  M.D.   On: 04/23/2018 14:27     Discharge Medications: Allergies as of 05/22/2018      Reactions   Flagyl [metronidazole Hcl]    Unknown to family.      Medication List    TAKE these medications   gabapentin 600 MG tablet Commonly known as:  NEURONTIN Take 1 tablet (600 mg total) by mouth 3 (three) times daily.   methocarbamol 500 MG tablet Commonly known as:  ROBAXIN Take 2 tablets (1,000 mg total) by mouth 4 (four) times daily.   Oxycodone HCl 20 MG Tabs Take 20 mg by mouth every 6 (six) hours as needed (severe pain).   senna-docusate 8.6-50 MG tablet Commonly known as:  Senokot-S Take 1 tablet by mouth 2 (two) times daily.       Follow Up Appointments: Follow-up Information    Loreli Slot, MD. Go on 06/05/2018.   Specialty:  Cardiothoracic Surgery Why:  PA/LAT CXR to be taken (at [facility] which is in the same building as Dr. Sunday Corn office) on 03/10 at 3:15;Appointment time is at 3:45 pm Contact information: [address] [place] [address] [phone]           Signed: Dale Edgewood 05/22/2018, 1:06 PM   Edited by Judie Petit. Roddenberry, PA-C 05/22/2018,  13:06

## 2018-05-19 ENCOUNTER — Inpatient Hospital Stay (HOSPITAL_COMMUNITY): Payer: Medicare HMO

## 2018-05-19 LAB — COMPREHENSIVE METABOLIC PANEL WITH GFR
ALT: 14 U/L (ref 0–44)
AST: 23 U/L (ref 15–41)
Albumin: 2.4 g/dL — ABNORMAL LOW (ref 3.5–5.0)
Alkaline Phosphatase: 181 U/L — ABNORMAL HIGH (ref 38–126)
Anion gap: 6 (ref 5–15)
BUN: 8 mg/dL (ref 8–23)
CO2: 29 mmol/L (ref 22–32)
Calcium: 7.7 mg/dL — ABNORMAL LOW (ref 8.9–10.3)
Chloride: 102 mmol/L (ref 98–111)
Creatinine, Ser: 0.68 mg/dL (ref 0.61–1.24)
GFR calc Af Amer: >60 mL/min (ref >60)
GFR calc non Af Amer: >60 mL/min (ref >60)
Glucose, Bld: 93 mg/dL (ref 70–99)
Potassium: 3.7 mmol/L (ref 3.5–5.1)
Sodium: 137 mmol/L (ref 135–145)
Total Bilirubin: 0.8 mg/dL (ref 0.3–1.2)
Total Protein: 5.8 g/dL — ABNORMAL LOW (ref 6.5–8.1)

## 2018-05-19 LAB — CBC
HCT: 33.7 % — ABNORMAL LOW (ref 39.0–52.0)
Hemoglobin: 10.7 g/dL — ABNORMAL LOW (ref 13.0–17.0)
MCH: 30 pg (ref 26.0–34.0)
MCHC: 31.8 g/dL (ref 30.0–36.0)
MCV: 94.4 fL (ref 80.0–100.0)
Platelets: 274 10*3/uL (ref 150–400)
RBC: 3.57 MIL/uL — ABNORMAL LOW (ref 4.22–5.81)
RDW: 13.5 % (ref 11.5–15.5)
WBC: 10.5 10*3/uL (ref 4.0–10.5)
nRBC: 0 % (ref 0.0–0.2)

## 2018-05-19 MED ORDER — IPRATROPIUM-ALBUTEROL 0.5-2.5 (3) MG/3ML IN SOLN: 3.0000 mL | RESPIRATORY_TRACT | Status: DC | PRN

## 2018-05-19 MED ORDER — GABAPENTIN 100 MG PO CAPS: 100.0000 mg | ORAL_CAPSULE | Freq: Three times a day (TID) | ORAL | Status: DC

## 2018-05-19 MED ORDER — HYDROMORPHONE HCL 1 MG/ML IJ SOLN: 1.0000 mg | INTRAMUSCULAR | Status: DC | PRN

## 2018-05-19 MED ORDER — GABAPENTIN 600 MG PO TABS
300.0000 mg | ORAL_TABLET | Freq: Two times a day (BID) | ORAL | Status: DC
  Administered 2018-05-19 – 2018-05-20 (×3): 300 mg via ORAL
  Filled 2018-05-19 (×4): qty 1

## 2018-05-19 NOTE — Progress Notes (Addendum)
2 Days Post-Op Procedure(s) (LRB): RIGHT VIDEO ASSISTED THORACOSCOPY, DRAINAGE OF PLEURAL EFFUSION (Right) Subjective: Some incisional pain, "got behind"  Objective: Vital signs in last 24 hours: Temp:  [98 F (36.7 C)-99.5 F (37.5 C)] 99.5 F (37.5 C) (02/22 0720) Pulse Rate:  [91-106] 91 (02/22 0401) Cardiac Rhythm: Sinus tachycardia;Bundle branch block (02/22 0800) Resp:  [11-22] 22 (02/22 0538) BP: (128-155)/(65-91) 147/91 (02/22 0720) SpO2:  [90 %-100 %] 93 % (02/22 0720)  Hemodynamic parameters for last 24 hours:    Intake/Output from previous day: 02/21 0701 - 02/22 0700 In: 1374.1 [P.O.:760; I.V.:614.1] Out: 1945 [Urine:1555; Chest Tube:390] Intake/Output this shift: No intake/output data recorded.  General appearance: alert, cooperative and no distress Neurologic: intact Heart: regular rate and rhythm Lungs: clear to auscultation bilaterally serous drainage form CT, no air leak  Lab Results: Recent Labs    05/18/18 0432 05/19/18 0726  WBC 10.0 10.5  HGB 11.2* 10.7*  HCT 34.3* 33.7*  PLT 297 274   BMET:  Recent Labs    05/17/18 0602 05/18/18 0432  NA 137 137  K 3.8 4.2  CL 103 106  CO2 24 26  GLUCOSE 90 144*  BUN 6* 9  CREATININE 0.70 0.66  CALCIUM 8.6* 8.0*    PT/INR:  Recent Labs    05/17/18 0602  LABPROT 17.3*  INR 1.43   ABG    Component Value Date/Time   PHART 7.426 05/18/2018 0425   HCO3 22.2 05/18/2018 0425   ACIDBASEDEF 1.6 05/18/2018 0425   O2SAT 93.5 05/18/2018 0425   CBG (last 3)  No results for input(s): GLUCAP in the last 72 hours.  Assessment/Plan: S/P Procedure(s) (LRB): RIGHT VIDEO ASSISTED THORACOSCOPY, DRAINAGE OF PLEURAL EFFUSION (Right) -POD # 2 Doing well Moderate drainage from CT-May be able to get anterior tube out tomorrow, but will keep posterior tube until < 150/ day.  No air leak- will place CT to water seal Pain control- will dc PCA and use PO pain meds- oxycodone 20 mg Q4 PRN, baseline home dose is  20 Q6 DC central line   LOS: 3 days    Loreli Slot 05/19/2018

## 2018-05-20 ENCOUNTER — Inpatient Hospital Stay (HOSPITAL_COMMUNITY): Payer: Medicare HMO

## 2018-05-20 MED ORDER — LUNG SURGERY BOOK
Freq: Once | Status: AC
  Administered 2018-05-20: 03:00:00
  Filled 2018-05-20: qty 1

## 2018-05-20 MED ORDER — GABAPENTIN 600 MG PO TABS
300.0000 mg | ORAL_TABLET | Freq: Three times a day (TID) | ORAL | Status: DC
  Administered 2018-05-20 – 2018-05-22 (×6): 300 mg via ORAL
  Filled 2018-05-20 (×6): qty 1

## 2018-05-20 NOTE — Progress Notes (Addendum)
      [address]       [place] [address]             [phone]      3 Days Post-Op Procedure(s) (LRB): RIGHT VIDEO ASSISTED THORACOSCOPY, DRAINAGE OF PLEURAL EFFUSION (Right) Subjective: Feels okay this morning.   Objective: Vital signs in last 24 hours: Temp:  [98.2 F (36.8 C)-98.7 F (37.1 C)] 98.7 F (37.1 C) (02/23 0745) Pulse Rate:  [80-89] 83 (02/23 0300) Cardiac Rhythm: Normal sinus rhythm (02/23 0700) Resp:  [14-26] 21 (02/23 0300) BP: (142-170)/(82-87) 142/82 (02/23 0300) SpO2:  [98 %] 98 % (02/23 0745)     Intake/Output from previous day: 02/22 0701 - 02/23 0700 In: 1400 [P.O.:1400] Out: 1700 [Urine:1550; Chest Tube:150] Intake/Output this shift: Total I/O In: 240 [P.O.:240] Out: 0   General appearance: alert, cooperative and no distress Heart: regular rate and rhythm, S1, S2 normal, no murmur, click, rub or gallop Lungs: clear to auscultation bilaterally Abdomen: soft, non-tender; bowel sounds normal; no masses,  no organomegaly Extremities: extremities normal, atraumatic, no cyanosis or edema Wound: clean and dry  Lab Results: Recent Labs    05/18/18 0432 05/19/18 0726  WBC 10.0 10.5  HGB 11.2* 10.7*  HCT 34.3* 33.7*  PLT 297 274   BMET:  Recent Labs    05/18/18 0432 05/19/18 0726  NA 137 137  K 4.2 3.7  CL 106 102  CO2 26 29  GLUCOSE 144* 93  BUN 9 8  CREATININE 0.66 0.68  CALCIUM 8.0* 7.7*    PT/INR: No results for input(s): LABPROT, INR in the last 72 hours. ABG    Component Value Date/Time   PHART 7.426 05/18/2018 0425   HCO3 22.2 05/18/2018 0425   ACIDBASEDEF 1.6 05/18/2018 0425   O2SAT 93.5 05/18/2018 0425   CBG (last 3)  No results for input(s): GLUCAP in the last 72 hours.  Assessment/Plan: S/P Procedure(s) (LRB): RIGHT VIDEO ASSISTED THORACOSCOPY, DRAINAGE OF PLEURAL EFFUSION (Right)  1. CV-NSR in the 80s, BP well controlled.  2. Pulm-tolerating room air with good oxygenation. Discontinue  anterior chest tube today and keep posterior chest tube until < 150cc/day. CT is to water seal. CXR stable. 3. Renal-creatinine 0.68, potassium ok 4. H and H 10.7/33.7 5. Pain-asking for a higher dose of Neurontin since he was on a much higher dose before. Will increase to 300mg  TID. Continue home pain medication of oxycodone 20mg  q 4 hours PRN.   Plan: Discontinue anterior chest tube. Keep posterior chest tube. No air leak.    LOS: 4 days    Sharlene Dory 05/20/2018  Chart reviewed, patient examined, agree with above. CXR looks good. DC anterior tube.

## 2018-05-20 NOTE — Plan of Care (Signed)
Patient ambulated throughout unit several times today. Tolerated ambulation very well, no shortness of breath, no pain, no dizziness. Progressing very well.    Problem: Education: Goal: Knowledge of General Education information will improve Description Including pain rating scale, medication(s)/side effects and non-pharmacologic comfort measures 05/20/2018 0125 by Billey Gosling D, RN Outcome: Progressing 05/20/2018 0125 by Excell Seltzer, Hertha Gergen D, RN Outcome: Progressing   Problem: Health Behavior/Discharge Planning: Goal: Ability to manage health-related needs will improve 05/20/2018 0125 by Billey Gosling D, RN Outcome: Progressing 05/20/2018 0125 by Excell Seltzer, Michaeleen Down D, RN Outcome: Progressing   Problem: Clinical Measurements: Goal: Ability to maintain clinical measurements within normal limits will improve 05/20/2018 0125 by Billey Gosling D, RN Outcome: Progressing 05/20/2018 0125 by Excell Seltzer, Lloyd Ayo D, RN Outcome: Progressing Goal: Will remain free from infection 05/20/2018 0125 by Billey Gosling D, RN Outcome: Progressing 05/20/2018 0125 by Excell Seltzer, Dameir Gentzler D, RN Outcome: Progressing Goal: Diagnostic test results will improve 05/20/2018 0125 by Billey Gosling D, RN Outcome: Progressing 05/20/2018 0125 by Excell Seltzer, Lunetta Marina D, RN Outcome: Progressing Goal: Respiratory complications will improve 05/20/2018 0125 by Billey Gosling D, RN Outcome: Progressing 05/20/2018 0125 by Excell Seltzer, Chamberlain Steinborn D, RN Outcome: Progressing Goal: Cardiovascular complication will be avoided 05/20/2018 0125 by Billey Gosling D, RN Outcome: Progressing 05/20/2018 0125 by Billey Gosling D, RN Outcome: Progressing   Problem: Activity: Goal: Risk for activity intolerance will decrease 05/20/2018 0125 by Billey Gosling D, RN Outcome: Progressing 05/20/2018 0125 by Excell Seltzer, Birdie Beveridge D, RN Outcome: Progressing   Problem: Nutrition: Goal: Adequate nutrition will be maintained 05/20/2018 0125 by Billey Gosling D, RN Outcome:  Progressing 05/20/2018 0125 by Excell Seltzer, Billie Trager D, RN Outcome: Progressing   Problem: Coping: Goal: Level of anxiety will decrease 05/20/2018 0125 by Billey Gosling D, RN Outcome: Progressing 05/20/2018 0125 by Excell Seltzer, Carman Auxier D, RN Outcome: Progressing   Problem: Safety: Goal: Ability to remain free from injury will improve 05/20/2018 0125 by Excell Seltzer, Emira Eubanks D, RN Outcome: Progressing 05/20/2018 0125 by Excell Seltzer, Elias Bordner D, RN Outcome: Progressing   Problem: Pain Managment: Goal: General experience of comfort will improve 05/20/2018 0125 by Billey Gosling D, RN Outcome: Progressing 05/20/2018 0125 by Excell Seltzer, Solene Hereford D, RN Outcome: Progressing   Problem: Skin Integrity: Goal: Risk for impaired skin integrity will decrease 05/20/2018 0125 by Billey Gosling D, RN Outcome: Progressing 05/20/2018 0125 by Billey Gosling D, RN Outcome: Progressing

## 2018-05-21 ENCOUNTER — Inpatient Hospital Stay (HOSPITAL_COMMUNITY): Payer: Medicare HMO

## 2018-05-21 NOTE — Care Management Note (Addendum)
Case Management Note Previous Note Created by Sidney Ace  Patient Details  Name: Francisco Floyd MRN: [id number] Date of Birth: 1956/02/08  Subjective/Objective:  Pt admitted on 05/16/18 with large Rt pleural effusion s/p recent multiple Rt rib fx with chest tube.  He was dc from [facility] on 05/01/18.  PTA, pt independent and living with family members.                   Action/Plan: Pt to OR today for Rt VATS and drainage of pleural effusion.  Will follow for discharge planning as pt progresses.  Expected Discharge Date:                  Expected Discharge Plan:  Home/Self Care  In-House Referral:     Discharge planning Services  CM Consult  Post Acute Care Choice:    Choice offered to:     DME Arranged:    DME Agency:     HH Arranged:    HH Agency:     Status of Service:  In process, will continue to follow  If discussed at Long Length of Stay Meetings, dates discussed:    Additional Comments: 05/21/2018 Pt now transferred to progressive unit, pt still has chest tube.  Pt independently ambulating in room.  CM will continue to follow for discharge planning.

## 2018-05-21 NOTE — Progress Notes (Signed)
4 Days Post-Op Procedure(s) (LRB): RIGHT VIDEO ASSISTED THORACOSCOPY, DRAINAGE OF PLEURAL EFFUSION (Right) Subjective: Rested well, ambulating independently. Pain control is adequate with his usual oral oxy-contin dose. No BM yet but says he is not uncomfortable.  Objective: Vital signs in last 24 hours: Temp:  [98 F (36.7 C)-99.7 F (37.6 C)] 98.5 F (36.9 C) (02/24 0751) Pulse Rate:  [91-102] 95 (02/24 0751) Cardiac Rhythm: Sinus tachycardia (02/23 1930) Resp:  [16-24] 24 (02/24 0751) BP: (135-169)/(81-93) 135/84 (02/24 0751) SpO2:  [95 %-98 %] 96 % (02/24 0751)     Intake/Output from previous day: 02/23 0701 - 02/24 0700 In: 1080 [P.O.:1080] Out: 3105 [Urine:2900; Chest Tube:205] Intake/Output this shift: Total I/O In: -  Out: 250 [Urine:250]  General appearance: alert, cooperative and no distress Heart: regular rate and rhythm Lungs: clear to auscultation bilaterally. No air leak, moderate serous CT drainage. CXR stable. Abdomen: soft, non-tender; bowel sounds normal Extremities: extremities normal, atraumatic, no cyanosis or edema Wound: clean and dry  Lab Results: Recent Labs    05/19/18 0726  WBC 10.5  HGB 10.7*  HCT 33.7*  PLT 274   BMET:  Recent Labs    05/19/18 0726  NA 137  K 3.7  CL 102  CO2 29  GLUCOSE 93  BUN 8  CREATININE 0.68  CALCIUM 7.7*    PT/INR: No results for input(s): LABPROT, INR in the last 72 hours. ABG    Component Value Date/Time   PHART 7.426 05/18/2018 0425   HCO3 22.2 05/18/2018 0425   ACIDBASEDEF 1.6 05/18/2018 0425   O2SAT 93.5 05/18/2018 0425   CBG (last 3)  No results for input(s): GLUCAP in the last 72 hours.  Assessment/Plan: S/P Procedure(s) (LRB): RIGHT VIDEO ASSISTED THORACOSCOPY, DRAINAGE OF PLEURAL EFFUSION (Right)  -POD5 rigfht VATS, drinage of complex pleural effusion and drainage of chest wall hematoma. No air leak. Drainage is slowing. Leave CT one more day. Respiratory status stable. -Chronic  pain syndrome--pain control is adequate on oxy-contin, neurontin.  -DVT PPX- continue Lovenox.    LOS: 5 days    Leary Roca, [place] [phone] 05/21/2018

## 2018-05-22 ENCOUNTER — Inpatient Hospital Stay (HOSPITAL_COMMUNITY): Payer: Medicare HMO

## 2018-05-22 LAB — AEROBIC/ANAEROBIC CULTURE (SURGICAL/DEEP WOUND)

## 2018-05-22 LAB — AEROBIC/ANAEROBIC CULTURE W GRAM STAIN (SURGICAL/DEEP WOUND)
Culture: NO GROWTH
Culture: NO GROWTH

## 2018-05-22 MED ORDER — METHOCARBAMOL 500 MG PO TABS: 1000.0000 mg | ORAL_TABLET | Freq: Four times a day (QID) | ORAL | 0 refills | Status: DC

## 2018-05-22 MED ORDER — GABAPENTIN 600 MG PO TABS: 600.0000 mg | ORAL_TABLET | Freq: Three times a day (TID) | ORAL | 0 refills | Status: DC

## 2018-05-22 NOTE — Progress Notes (Addendum)
5 Days Post-Op Procedure(s) (LRB): RIGHT VIDEO ASSISTED THORACOSCOPY, DRAINAGE OF PLEURAL EFFUSION (Right) Subjective: Awake and alert.  Feeling well, no complaints.  Denies shortness of breath.  Remains on room air with adequate oxygen saturation.  BM yesterday.  Objective: Vital signs in last 24 hours: Temp:  [98.4 F (36.9 C)-98.7 F (37.1 C)] 98.4 F (36.9 C) (02/25 0355) Pulse Rate:  [81-106] 105 (02/25 0730) Cardiac Rhythm: Sinus tachycardia (02/25 0730) Resp:  [15-23] 20 (02/25 0730) BP: (143-149)/(83-91) 149/85 (02/25 0730) SpO2:  [93 %-98 %] 95 % (02/25 0730)     Intake/Output from previous day: 02/24 0701 - 02/25 0700 In: -  Out: 1870 [Urine:1710; Chest Tube:160] Intake/Output this shift: Total I/O In: -  Out: 400 [Urine:400]  General appearance:alert, cooperative and no distress Heart:regular rate and rhythm Lungs:clear to auscultation bilaterally. No air leak, minimal serous CT drainage. CXR stable with no PTX. Abdomen:soft, non-tender; bowel sounds normal Extremities:extremities normal, atraumatic, no cyanosis or edema Wound:clean and dry  Lab Results: No results for input(s): WBC, HGB, HCT, PLT in the last 72 hours. BMET: No results for input(s): NA, K, CL, CO2, GLUCOSE, BUN, CREATININE, CALCIUM in the last 72 hours.  PT/INR: No results for input(s): LABPROT, INR in the last 72 hours. ABG    Component Value Date/Time   PHART 7.426 05/18/2018 0425   HCO3 22.2 05/18/2018 0425   ACIDBASEDEF 1.6 05/18/2018 0425   O2SAT 93.5 05/18/2018 0425   CBG (last 3)  No results for input(s): GLUCAP in the last 72 hours.  Assessment/Plan: S/P Procedure(s) (LRB): RIGHT VIDEO ASSISTED THORACOSCOPY, DRAINAGE OF PLEURAL EFFUSION (Right)  Remove remaining chest tube. Repeat CXR later today and discharge home if CXR and respiratory status stable.   LOS: 6 days    Parke Poisson [phone] 05/22/2018 Patient seen and examined, agreed with  above Likely home later today  Viviann Spare C. Dorris Fetch, MD [facility] [phone]

## 2018-05-22 NOTE — Progress Notes (Signed)
Removed PIV access and pt received discharge instructions. Pt understood it well. Pt took his all belongings and he refused home health care. HS [employer]

## 2018-05-22 NOTE — Discharge Instructions (Addendum)
Video-Assisted Thoracic Surgery, Care After This sheet gives you information about how to care for yourself after your procedure. Your doctor may also give you more specific instructions. If you have problems or questions, contact your doctor. What can I expect after the procedure? After the procedure, it is common to have:  Some pain and soreness in your chest.  Pain when you breathe in (inhale) and cough.  Trouble pooping (constipation).  Tiredness (fatigue).  Trouble sleeping. Follow these instructions at home: Preventing lung infection (pneumonia)  Take deep breaths or do breathing exercises as told by your doctor.  Cough often. Coughing is important to clear thick spit (phlegm) and open your lungs.  You can make coughing hurt less if you try supporting (splinting) your chest. Try one of these when you cough: ? Hold a pillow against your chest. ? Place both hands flat on top of your cut.  Use an incentive spirometer as told by your doctor. This is a tool that measures how well you can fill your lungs with each breath.  Do lung therapy (pulmonary rehabilitation) as told. Medicines  Take over-the-counter or prescription medicines only as told by your doctor.  If you have pain, take pain-relieving medicine before your pain gets very bad. Doing this will help you breathe and cough more comfortably.  If you were prescribed an antibiotic medicine, take it as told by your doctor. Do not stop taking the antibiotic even if you start to feel better. Activity  Ask your doctor what activities are safe for you.  Avoid activities that use your chest muscles for 3-4 weeks or longer.  Do not lift anything that is heavier than 10 lb (4.5 kg), or the limit that your doctor tells you, until he or she says that it is safe. Cut (incision) care  Follow instructions from your doctor about how to take care of your cut(s) from surgery. Make sure you: ? Wash your hands with soap and water  before you change your bandage (dressing). If you cannot use soap and water, use hand sanitizer. ? Change your bandage as told by your doctor. ? Leave stitches (sutures), skin glue, or skin tape (adhesive) strips in place. They may need to stay in place for 2 weeks or longer. If tape strips get loose and curl up, you may trim the loose edges. Do not remove tape strips completely unless your doctor says it is okay.  Keep your bandage dry until it has been removed.  Every day, check the area around your cut(s) for signs of infection. Check for: ? Redness, swelling, or pain. ? Fluid or blood. ? Warmth. ? Pus or a bad smell. Bathing  Do not take baths, swim, or use a hot tub until your doctor approves. You may take showers.  After your bandage is removed, use soap and water to gently wash the your cut(s) from surgery. Do not use anything else to clean your cut(s) unless your doctor tells you to do this. Driving   Do not drive until your doctor approves.  Do not drive or use heavy machinery while taking prescription pain medicine. Eating and drinking  Eat a healthy diet as told by your doctor. A healthy diet includes: ? Lots of fresh fruits and vegetables. ? Whole grains. ? Low-fat (lean) proteins.  Limit foods that are high in fat and processed sugars. These include fried and sweet foods.  Drink enough fluid to keep your pee (urine) clear or light yellow. General instructions  To prevent or treat trouble pooping while you are taking prescription pain medicine, your doctor may recommend that you: ? Take over-the-counter or prescription medicines. ? Eat foods that have a lot of fiber. These include beans, fresh fruits and vegetables, and whole grains.  Do not use any products that contain nicotine or tobacco. These include cigarettes and e-cigarettes. If you need help quitting, ask your doctor.  Avoid being where people are smoking (avoid secondhand smoke).  Wear compression  stockings as told by your doctor. These stockings help you: ? Not get blood clots in your legs. ? Have less swelling in your legs.  If you have a chest tube, care for it as told by your doctor.  Do not travel by airplane during the 2 weeks after your chest tube is removed, or until your doctor says that this is safe.  Keep all follow-up visits as told by your doctor. This is important. Contact a doctor if:  You have redness, swelling, or pain around a cut from surgery.  You have fluid or blood coming from a cut from surgery.  Your cut(s) from surgery feel warm to the touch.  You have pus or a bad smell coming from a cut from surgery.  You have a fever or chills.  You feel sick to your stomach (nauseous).  You throw up (vomit).  You have pain that does not get better with medicine. Get help right away if:  You have chest pain.  Your heart is fluttering or beating fast.  You start to have a rash.  You have shortness of breath.  You have trouble breathing.  You are confused.  You have trouble talking or understanding.  You feel weak, light-headed, or dizzy.  You faint. Summary  To help prevent lung infection (pneumonia), take deep breaths or do breathing exercises as told by your doctor.  Cough often. This is important for clearing chest fluid (phlegm). You can make coughing hurt less if you hold a pillow to your chest or put your hands flat on the cut(s) when you cough (do splinting).  Do not drive until your doctor approves.  Every day, check your cut(s) for signs of infection. These signs can be redness, swelling, pain, fluid, blood, warmth, pus, or a bad smell.  Eat a healthy diet. This includes lots of fresh fruits and vegetables, whole grains, and low-fat (lean) proteins. This information is not intended to replace advice given to you by your health care provider. Make sure you discuss any questions you have with your health care provider. Document  Released: 07/09/2012 Document Revised: 02/22/2016 Document Reviewed: 02/22/2016 Elsevier Interactive Patient Education  2019 [employer]. Discharge Instructions:  1. You may shower, please wash incisions daily with soap and water and keep dry.  If you wish to cover wounds with dressing you may do so but please keep clean and change daily.  No tub baths or swimming until incisions have completely healed.  If your incisions become red or develop any drainage please call our office at [phone]  2. No Driving until cleared by Dr. Sunday Corn office and you are no longer using narcotic pain medications  3. Fever of 101.5 for at least 24 hours with no source, please contact our office at [phone]  4. Activity- up as tolerated, please walk at least 3 times per day.    5. If any questions or concerns arise, please do not hesitate to contact our office at [phone]

## 2018-05-22 NOTE — Care Management Note (Signed)
Case Management Note Previous Note Created by Sidney Ace  Patient Details  Name: Francisco Floyd MRN: [id number] Date of Birth: 07/15/1955  Subjective/Objective:  Pt admitted on 05/16/18 with large Rt pleural effusion s/p recent multiple Rt rib fx with chest tube.  He was dc from [facility] on 05/01/18.  PTA, pt independent and living with family members.                   Action/Plan: Pt to OR today for Rt VATS and drainage of pleural effusion.  Will follow for discharge planning as pt progresses.  Expected Discharge Date:  05/22/18               Expected Discharge Plan:  Home/Self Care  In-House Referral:     Discharge planning Services  CM Consult  Post Acute Care Choice:    Choice offered to:     DME Arranged:    DME Agency:     HH Arranged:    HH Agency:     Status of Service:  In process, will continue to follow  If discussed at Long Length of Stay Meetings, dates discussed:    Additional Comments: 05/22/2018  Pt to discharge home today.  Pt remains independent with mobility.  Pt has PCP and denied barriers with paying for medications as prescribed.  No CM needs determined - CM signing off  05/21/18 Pt now transferred to progressive unit, pt still has chest tube.  Pt independently ambulating in room.  CM will continue to follow for discharge planning.

## 2018-05-22 NOTE — Care Management Important Message (Signed)
Important Message  Patient Details  Name: Francisco Floyd MRN: [id number] Date of Birth: 08-28-1955   Medicare Important Message Given:  Yes    Renie Ora 05/22/2018, 12:31 PM

## 2018-06-04 ENCOUNTER — Other Ambulatory Visit: Payer: Self-pay | Admitting: Thoracic Surgery (Cardiothoracic Vascular Surgery)

## 2018-06-04 DIAGNOSIS — J90 Pleural effusion, not elsewhere classified: Secondary | ICD-10-CM

## 2018-06-05 ENCOUNTER — Other Ambulatory Visit: Payer: Self-pay

## 2018-06-05 ENCOUNTER — Ambulatory Visit
Admission: RE | Admit: 2018-06-05 | Discharge: 2018-06-05 | Disposition: A | Discharge status: Home / Self Care | Payer: Medicare HMO | Source: Ambulatory Visit | Attending: Thoracic Surgery (Cardiothoracic Vascular Surgery) | Admitting: Thoracic Surgery (Cardiothoracic Vascular Surgery)

## 2018-06-05 ENCOUNTER — Ambulatory Visit (INDEPENDENT_AMBULATORY_CARE_PROVIDER_SITE_OTHER): Payer: Self-pay | Admitting: Thoracic Surgery (Cardiothoracic Vascular Surgery)

## 2018-06-05 ENCOUNTER — Encounter: Payer: Self-pay | Admitting: Thoracic Surgery (Cardiothoracic Vascular Surgery)

## 2018-06-05 VITALS — BP 153/92 | HR 85 | Resp 16 | Ht 67.0 in | Wt 222.0 lb

## 2018-06-05 DIAGNOSIS — J90 Pleural effusion, not elsewhere classified: Secondary | ICD-10-CM

## 2018-06-05 DIAGNOSIS — Z9889 Other specified postprocedural states: Secondary | ICD-10-CM

## 2018-06-05 DIAGNOSIS — S2241XA Multiple fractures of ribs, right side, initial encounter for closed fracture: Secondary | ICD-10-CM

## 2018-06-05 DIAGNOSIS — J942 Hemothorax: Secondary | ICD-10-CM

## 2018-06-05 NOTE — Progress Notes (Signed)
[address]       [place] [address]             [phone]       HPI: Mr. Zimmerli returns for scheduled follow-up visit   Demarlo Bhola is a 63 year old man who fell out of a tree back in January.  He was admitted with multiple rib fractures and required a chest tube.  He was discharged after about a week.  He then returned with shortness of breath.  He was found to have a loculated right pleural effusion on chest CT.  I did a right VATS for drainage of his effusion and large subpleural hematoma.  About a liter and a half of fluid was drained from the chest.  Postoperatively he did well and went home on day 5.  Still have some soreness in the area.  He takes 20 mg of oxycodone every 6 hours chronically in addition to Neurontin.  He not had to increase his pain medication any.  His breathing is markedly better than it was prior to the procedure.  Past Medical History:  Diagnosis Date  . Cirrhosis of liver (HCC)   . Hepatitis C   . Pleural effusion 04/2018  . Rib fractures    Current Outpatient Medications  Medication Sig Dispense Refill  . gabapentin (NEURONTIN) 300 MG capsule Take 600 mg by mouth 3 (three) times daily.    . Oxycodone HCl 20 MG TABS Take 20 mg by mouth every 6 (six) hours as needed (severe pain).     No current facility-administered medications for this visit.     Physical Exam BP (!) 153/92 (BP Location: Left Arm, Patient Position: Sitting, Cuff Size: Large)   Pulse 85   Resp 16   Ht 5\' 7"  (1.702 m)   Wt 222 lb (100.7 kg)   SpO2 97% Comment: RA  BMI 34.74 kg/m  63 year old man in no acute distress Alert and oriented x3 with no focal deficits Lungs slightly diminished at right base but otherwise clear Incision clean dry and intact, minimal erythema at chest tube sites  Diagnostic Tests: CHEST - 2 VIEW  COMPARISON:  05/22/2018 and earlier.  FINDINGS: Multilevel healing right rib fractures. Small surgical clips in  the right chest wall. Mild architectural distortion at the right hilum and right lung base. No pneumothorax. No pulmonary edema. Mild thickening of the right side fissures. Small if any residual right pleural effusion. Mediastinal contours remain normal. Left lung volume remains normal, and the left lung appears stable and clear compared to 2017.  No new No acute osseous abnormality identified. Negative visible bowel gas pattern.  IMPRESSION: 1. Posttraumatic and postoperative changes in the right chest with mild architectural distortion and small if any residual right pleural effusion. 2. No new cardiopulmonary abnormality.   Electronically Signed   By: Odessa Fleming M.D.   On: 06/05/2018 15:57  I personally reviewed the chest x-ray images and concur with the findings noted above  Impression: Briana Wingert is a 63 year old gentleman who fell out of a tree about a month and a half ago.  He had multiple rib fractures.  He required a chest tube placement.  He was discharged home after about a week but presented back with worsening shortness of breath.  He was found to have a large loculated pleural effusion.  I did a right VATS to drain the effusion.  The effusion had some loculations.  There also was a large subpleural  hematoma from the rib fractures.  We drained that as well.  He did well postoperatively.  He continues to do well since discharge.  He is on his baseline narcotic regimen.  He really is not having much pain even with the rib fractures.  His breathing has improved dramatically since drainage.  He does not drive due to his chronic narcotic use.  Other than that there are no restrictions on his activities from my standpoint however I did advise him to build into new activities gradually and avoid climbing trees and ladders.  He does have some mild erythema at the chest tube sites.  That is likely local irritation from the tubes and the sutures.  I advised him to put  Neosporin ointment on those areas.  Plan:  I will be happy to see Mr. Cober back anytime in the future if I can be of any further assistance with his care.  Loreli Slot, MD [facility] [phone]

## 2019-02-26 DEATH — deceased

## 2020-07-08 IMAGING — CT CT CERVICAL SPINE W/O CM
4 of 8 series · 11 of 33 positions shown, 12 images · non-contrast
Comparison: None.

CLINICAL DATA: 62-year-old male with a history of fall from a
ladder

EXAM:
CT HEAD WITHOUT CONTRAST
CT CERVICAL SPINE WITHOUT CONTRAST
TECHNIQUE: Multidetector CT imaging of the head and cervical spine was
performed following the standard protocol without intravenous
contrast. Multiplanar CT image reconstructions of the cervical spine
were also generated.

[Series 9: c spine soft · axial · 0.34mm/px · z∈[-245,-179]mm · 2 of 100 slices shown]
[im 34/100  soft-tissue]
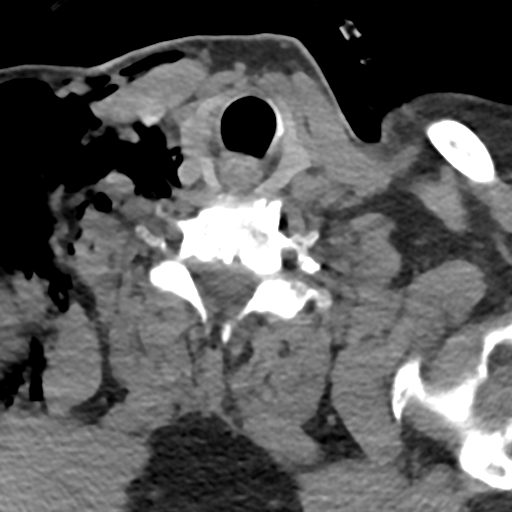
[im 67/100  soft-tissue]
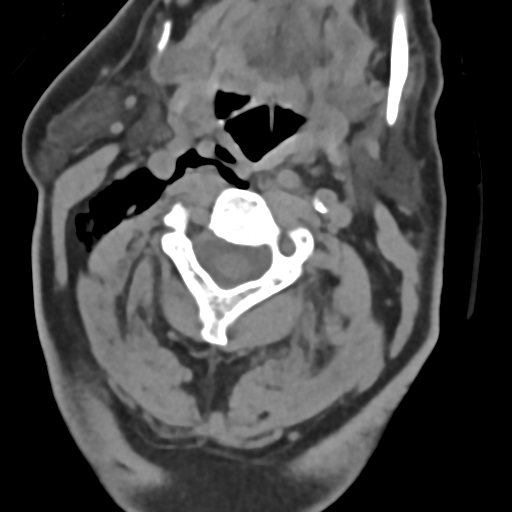

[Series 10: sag bone · sagittal · 0.21mm/px · 4 of 61 slices shown]
[im 13/61  bone]
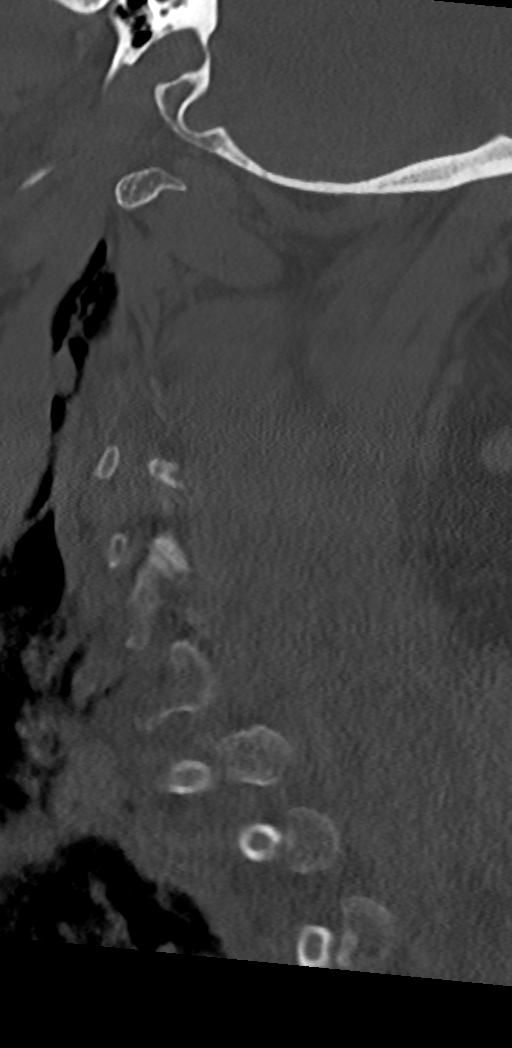
[im 25/61  bone]
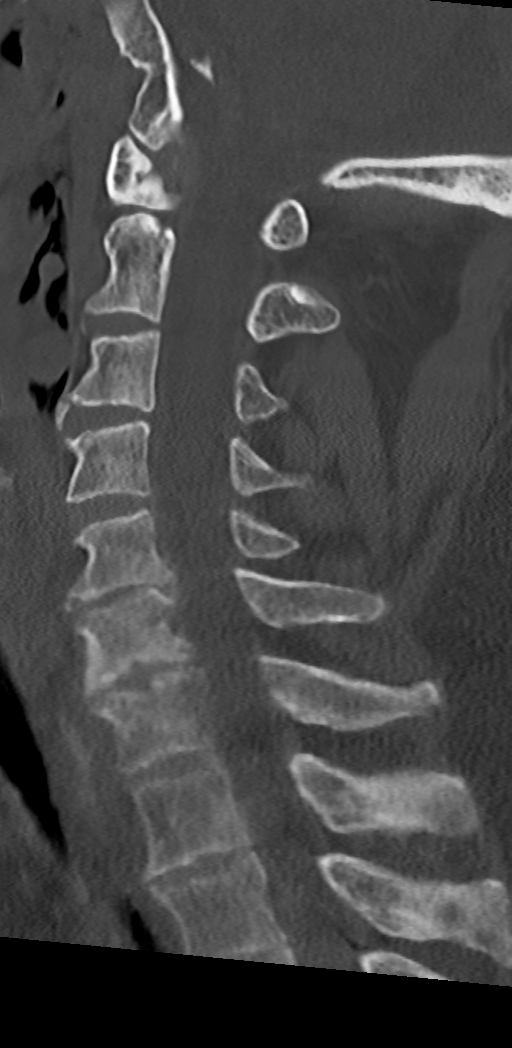
[im 37/61  bone]
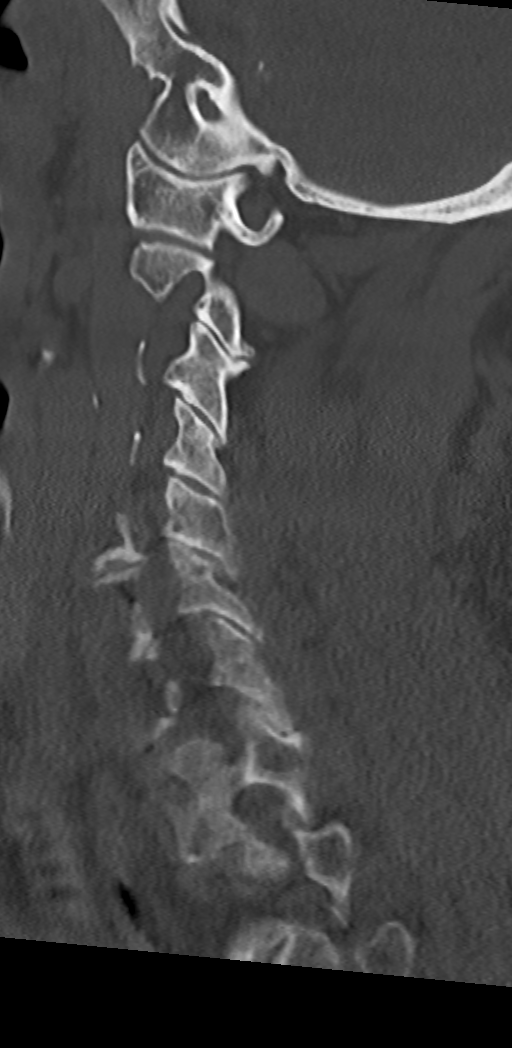
[im 49/61  bone]
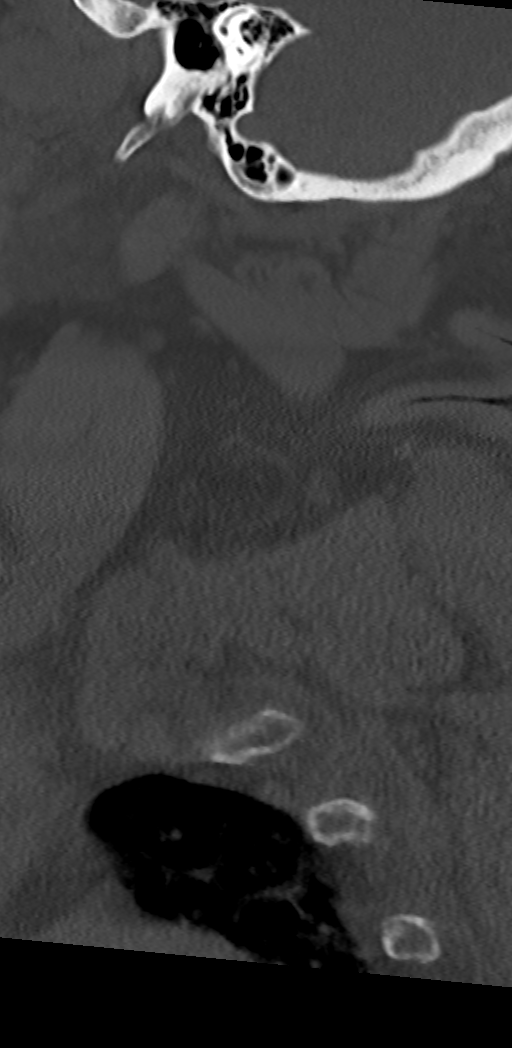

[Series 11: cor bone · coronal · 0.29mm/px · 3 of 61 slices shown]
[im 16/61  bone]
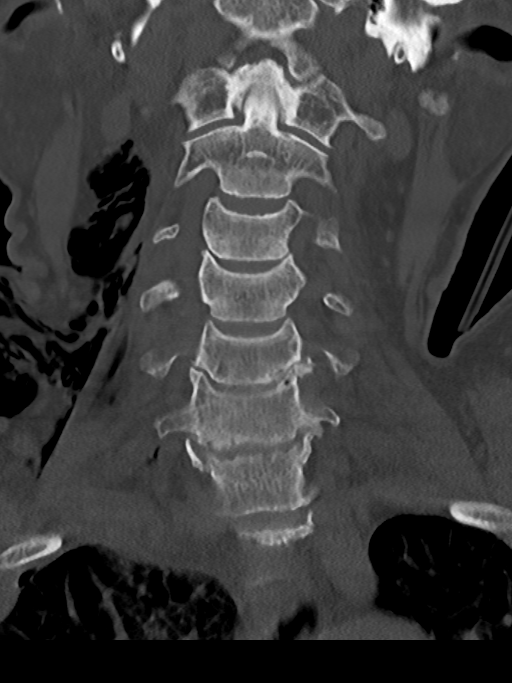
[im 31/61  bone]
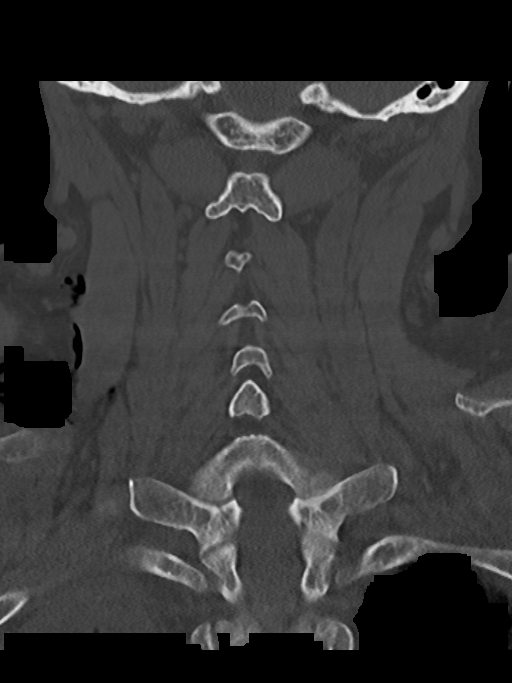
[im 46/61  bone]
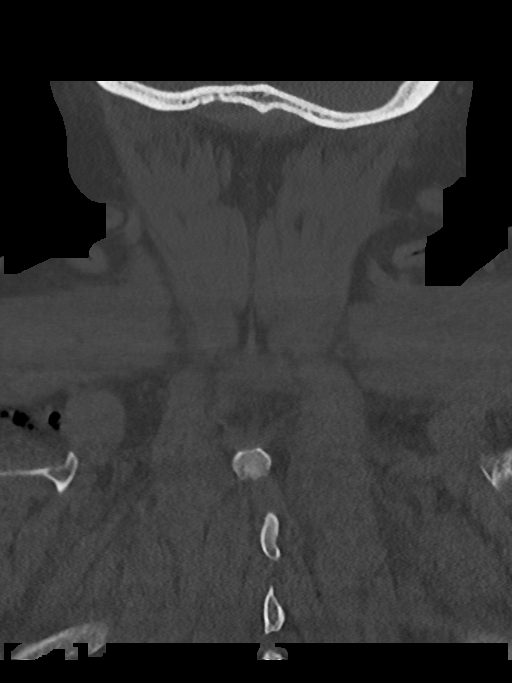

[Series 12: orthogonal axials · axial · 0.21mm/px · z∈[-270,-197]mm · 2 of 96 slices shown, 3 images]
[im 32/96  soft-tissue]
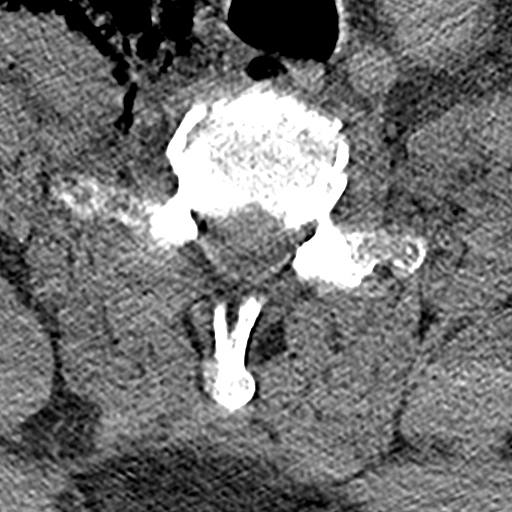
[im 32/96  bone]
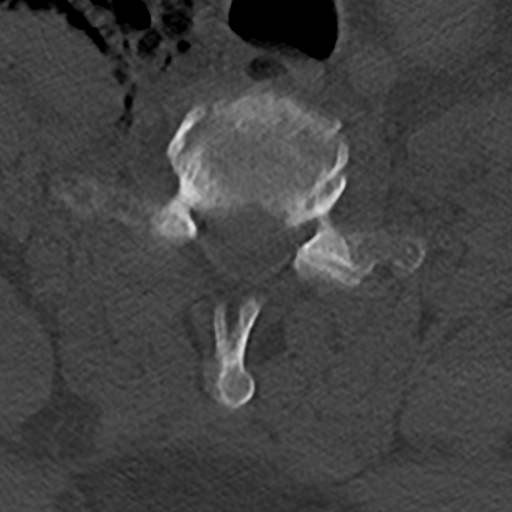
[im 64/96  bone]
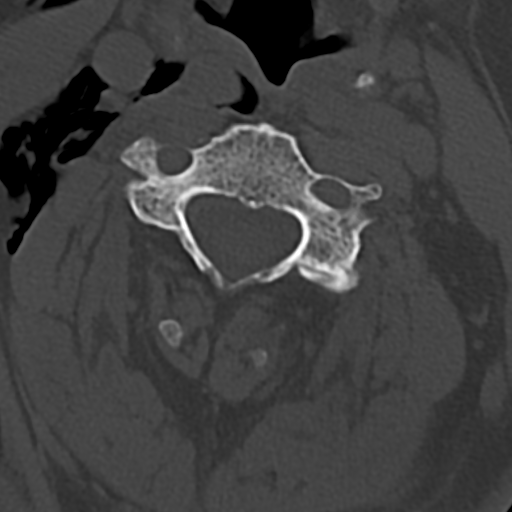

[11 of 33 positions shown; findings below may reference images not displayed]

FINDINGS: CT HEAD FINDINGS

Brain: No acute intracranial hemorrhage. No midline shift or mass
effect. Gray-white differentiation maintained. Unremarkable
appearance of the ventricular system.

Vascular: Vascular

Skull: No acute fracture.  No aggressive bone lesion identified.

Sinuses/Orbits: Unremarkable appearance of the orbits. Mastoid air
cells clear. No middle ear effusion. No significant sinus disease.

Other: None

CT CERVICAL SPINE FINDINGS

Alignment: Craniocervical junction aligned. Anatomic alignment of
the cervical elements. No subluxation.

Skull base and vertebrae: No acute fracture at the skullbase.
Vertebral body heights relatively maintained. No acute fracture
identified.

Soft tissues and spinal canal: Subcutaneous/myofacial gas of the
right right neck and right chest extending from the inferior aspects
of the exam along the musculature nearly to the skull base. No canal
hematoma. No adenopathy. No radiopaque metallic foreign body

Disc levels:  Disc space narrowing throughout the cervical spine.

C2-C3: Mild disc disease without significant canal narrowing or
foraminal narrowing.

C3-C4: Mild disc disease and uncovertebral joint disease without
significant foraminal narrowing or canal narrowing.

C4-C5: No significant canal narrowing or foraminal narrowing with
mild disc disease.

C5-C6: Disc osteophyte complex with posterior disc bulge/protrusion,
eccentric to the left appearing to contact the ventral thecal sac.
Associated uncovertebral joint disease bilaterally with facet
disease contributing to bilateral foraminal narrowing.

C6-C7: Posterior disc osteophyte complex with uncovertebral joint
disease contributing to mild bilateral foraminal narrowing. No
significant bony canal narrowing.

C7-T1: No significant canal narrowing or foraminal narrowing.

Upper chest: Tiny right apical pneumothorax. Fractures of the
posterior right second and third ribs at the articulation. Displaced
anterior right first rib fracture. Myofacial/subcutaneous gas at the
right chest and right neck. Mixed interstitial and airspace disease
at the right lung apex. No left pneumothorax.

Other: Mild carotid calcifications.
IMPRESSION: Head CT:

Negative head CT for acute intracranial abnormality.

Cervical CT:

No acute fracture or malalignment of the cervical spine.

Multilevel degenerative changes, as above. Worst degree of disc
disease present at C5-C6 where there is posterior disc
bulge/protrusion, eccentric to the left that may contact the ventral
thecal sac.

**An incidental finding of potential clinical significance has been
found. Small right pneumothorax, better characterized on
contemporaneous chest CT with associated right first, second, third
rib fractures and subcutaneous/myofacial emphysema.**

These results were called by telephone at the time of interpretation
on 04/23/2018 at [DATE] to Dr. VINU FUSCO.

## 2020-07-10 IMAGING — DX DG CHEST 1V PORT
1 series · 1 of 1 positions shown · non-contrast
Comparison: 04/24/2018.

CLINICAL DATA: Rib fractures.  Fall.  Hemo pneumothorax.

EXAM:
PORTABLE CHEST 1 VIEW

[chest ap]
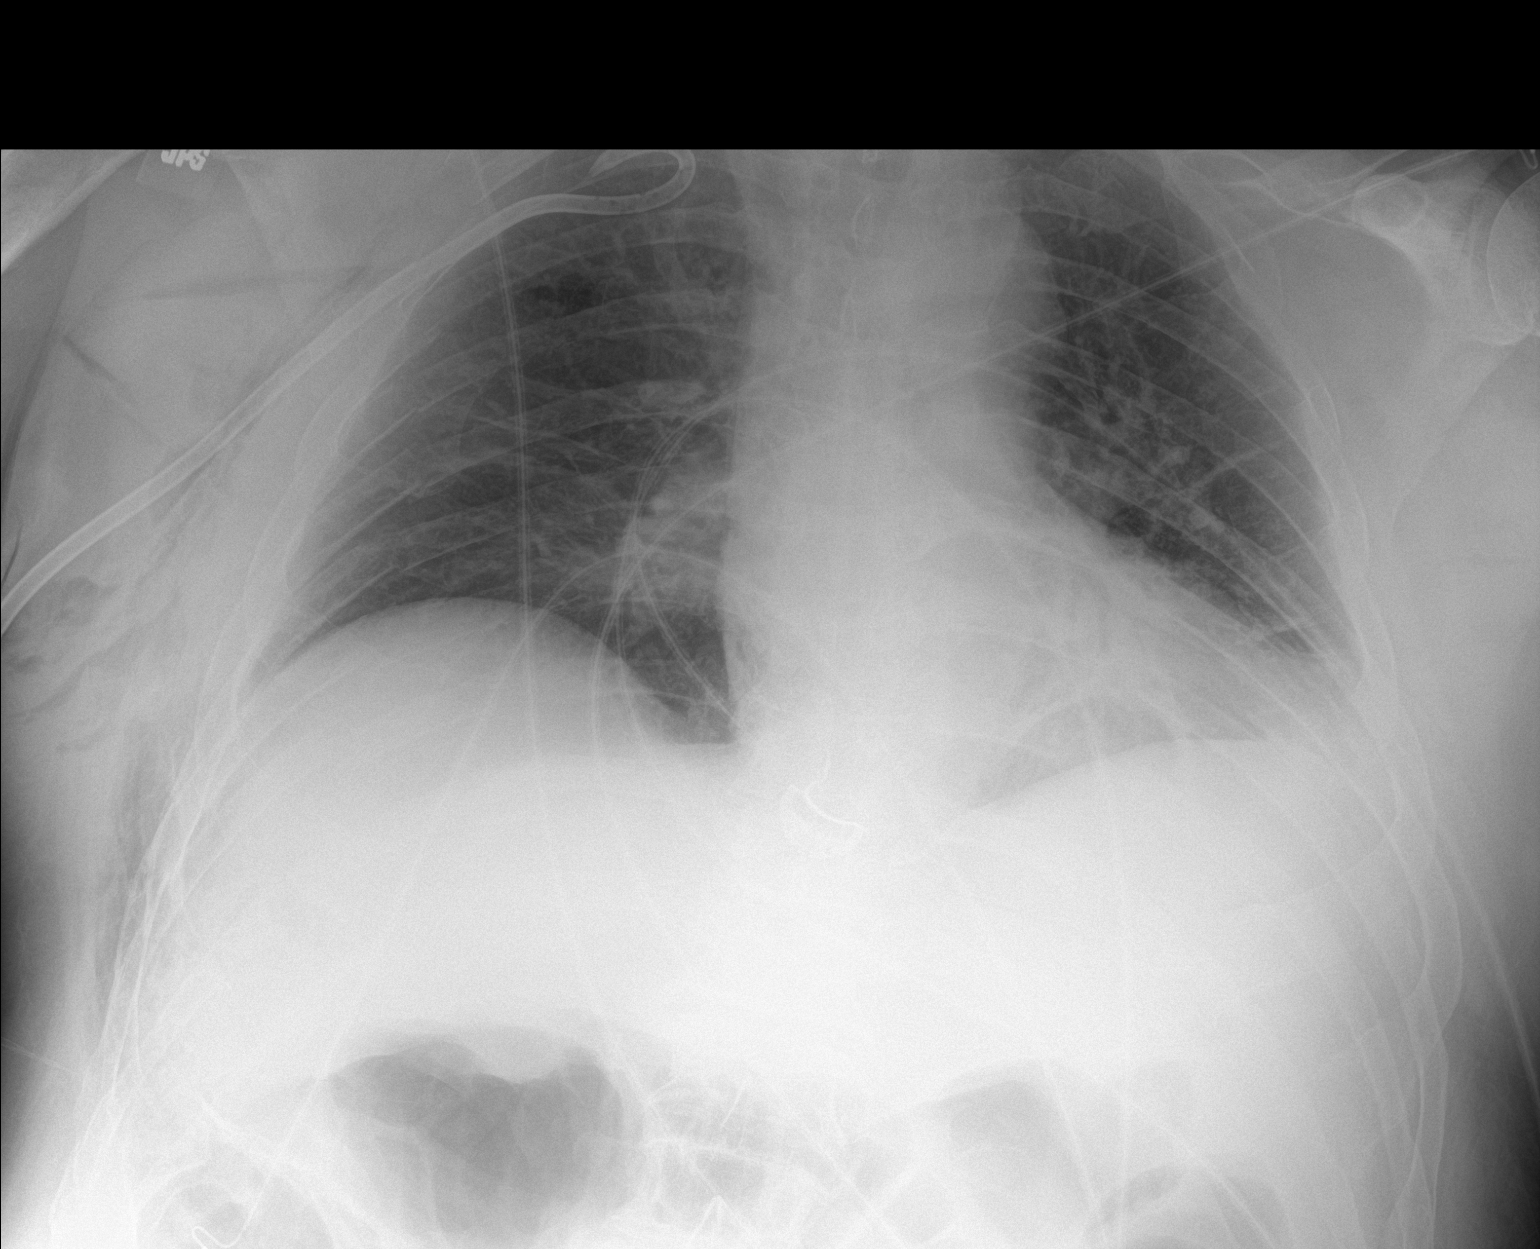

[1 of 1 positions shown; findings below may reference images not displayed]

FINDINGS: RIGHT pigtail catheter good position. No visible pneumothorax.
Unchanged RIGHT-sided rib fractures. Stable subcutaneous emphysema.
IMPRESSION: Stable RIGHT chest tube without pneumothorax.

## 2020-07-11 IMAGING — DX DG CHEST 1V PORT
1 series · 1 of 1 positions shown · non-contrast
Comparison: 04/25/2018

CLINICAL DATA: Follow-up hemothorax

EXAM:
PORTABLE CHEST 1 VIEW

[chest ap]
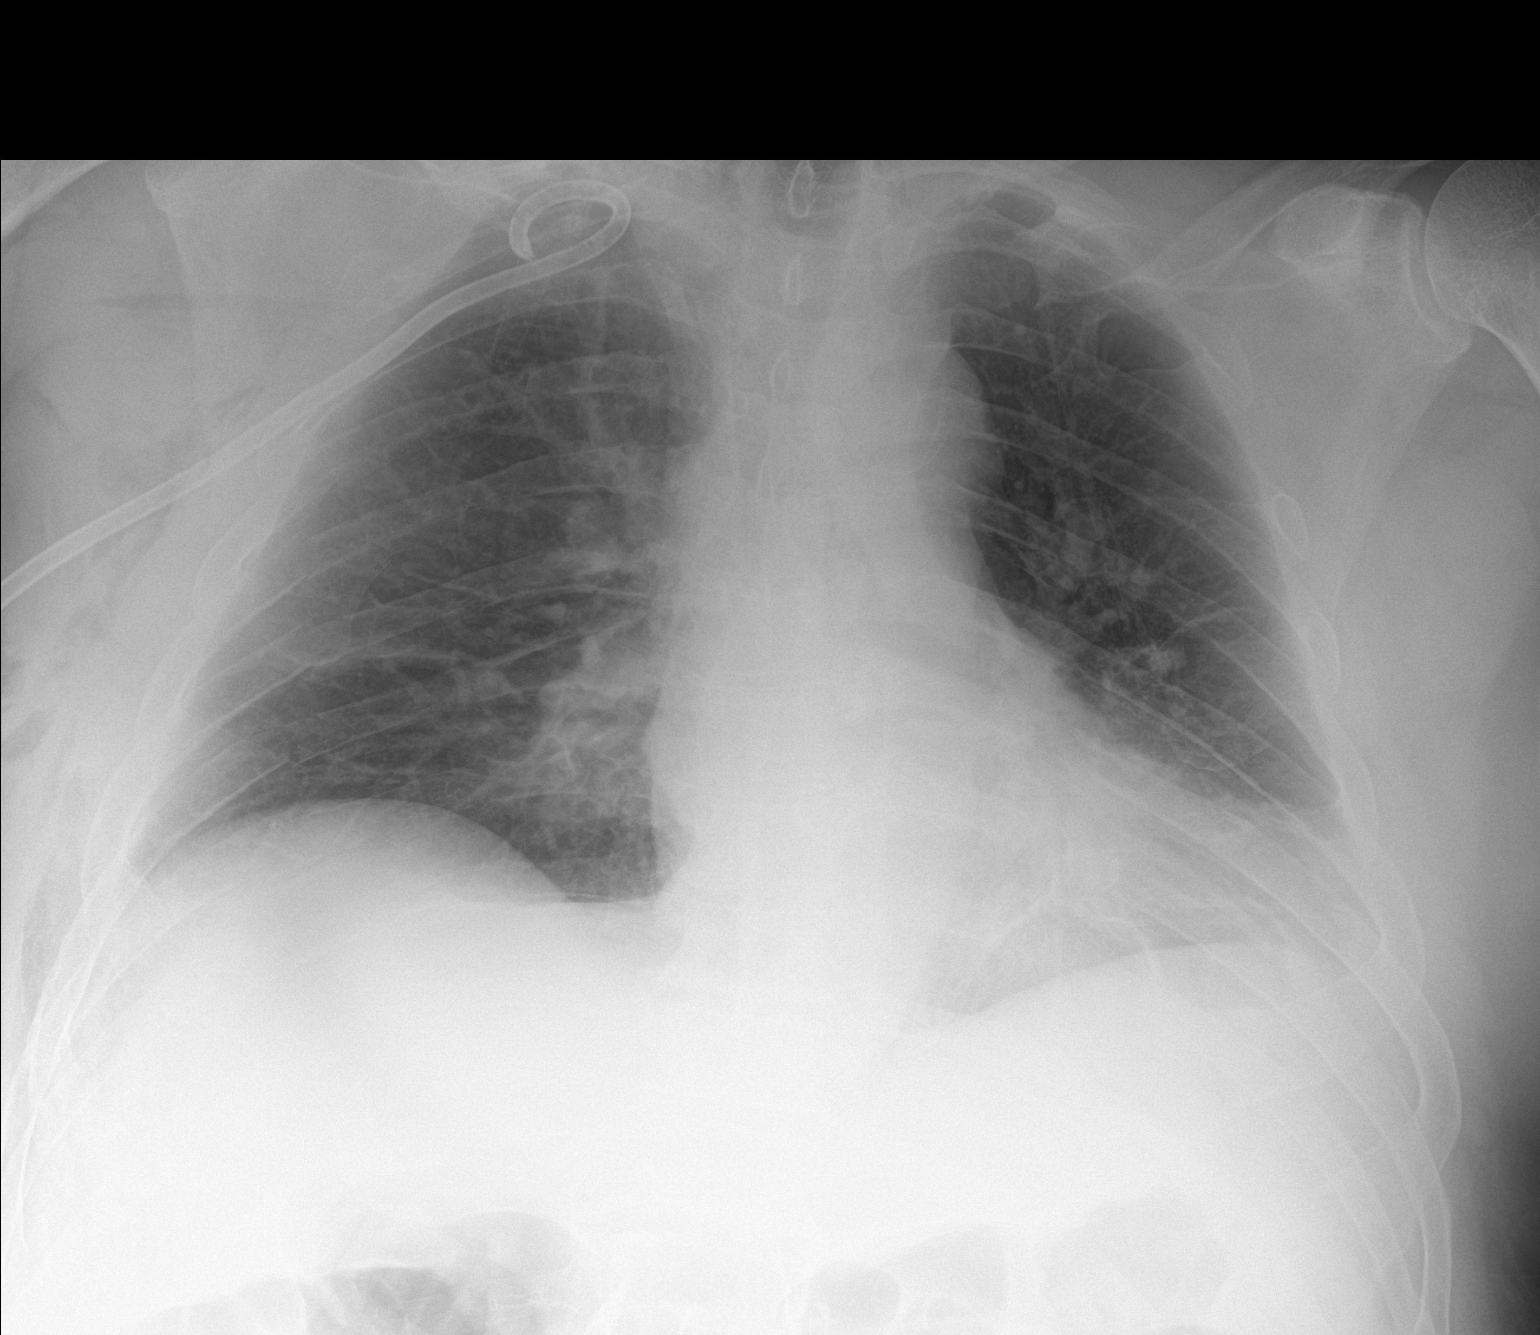

[1 of 1 positions shown; findings below may reference images not displayed]

FINDINGS: Cardiac shadow is stable. Right apical chest tube is noted without
evidence of pneumothorax. Multiple right-sided rib fractures are
again noted. No focal infiltrate or sizable effusion is seen.
IMPRESSION: No pneumothorax identified.  Chest tube remains in place.

Multiple right-sided rib fractures are again seen

## 2020-07-13 IMAGING — DX DG CHEST 1V PORT
1 series · 1 of 1 positions shown · non-contrast
Comparison: Chest x-ray from yesterday.

CLINICAL DATA: Right-sided rib fractures and hemothorax follow-up.

EXAM:
PORTABLE CHEST 1 VIEW

[chest ap]
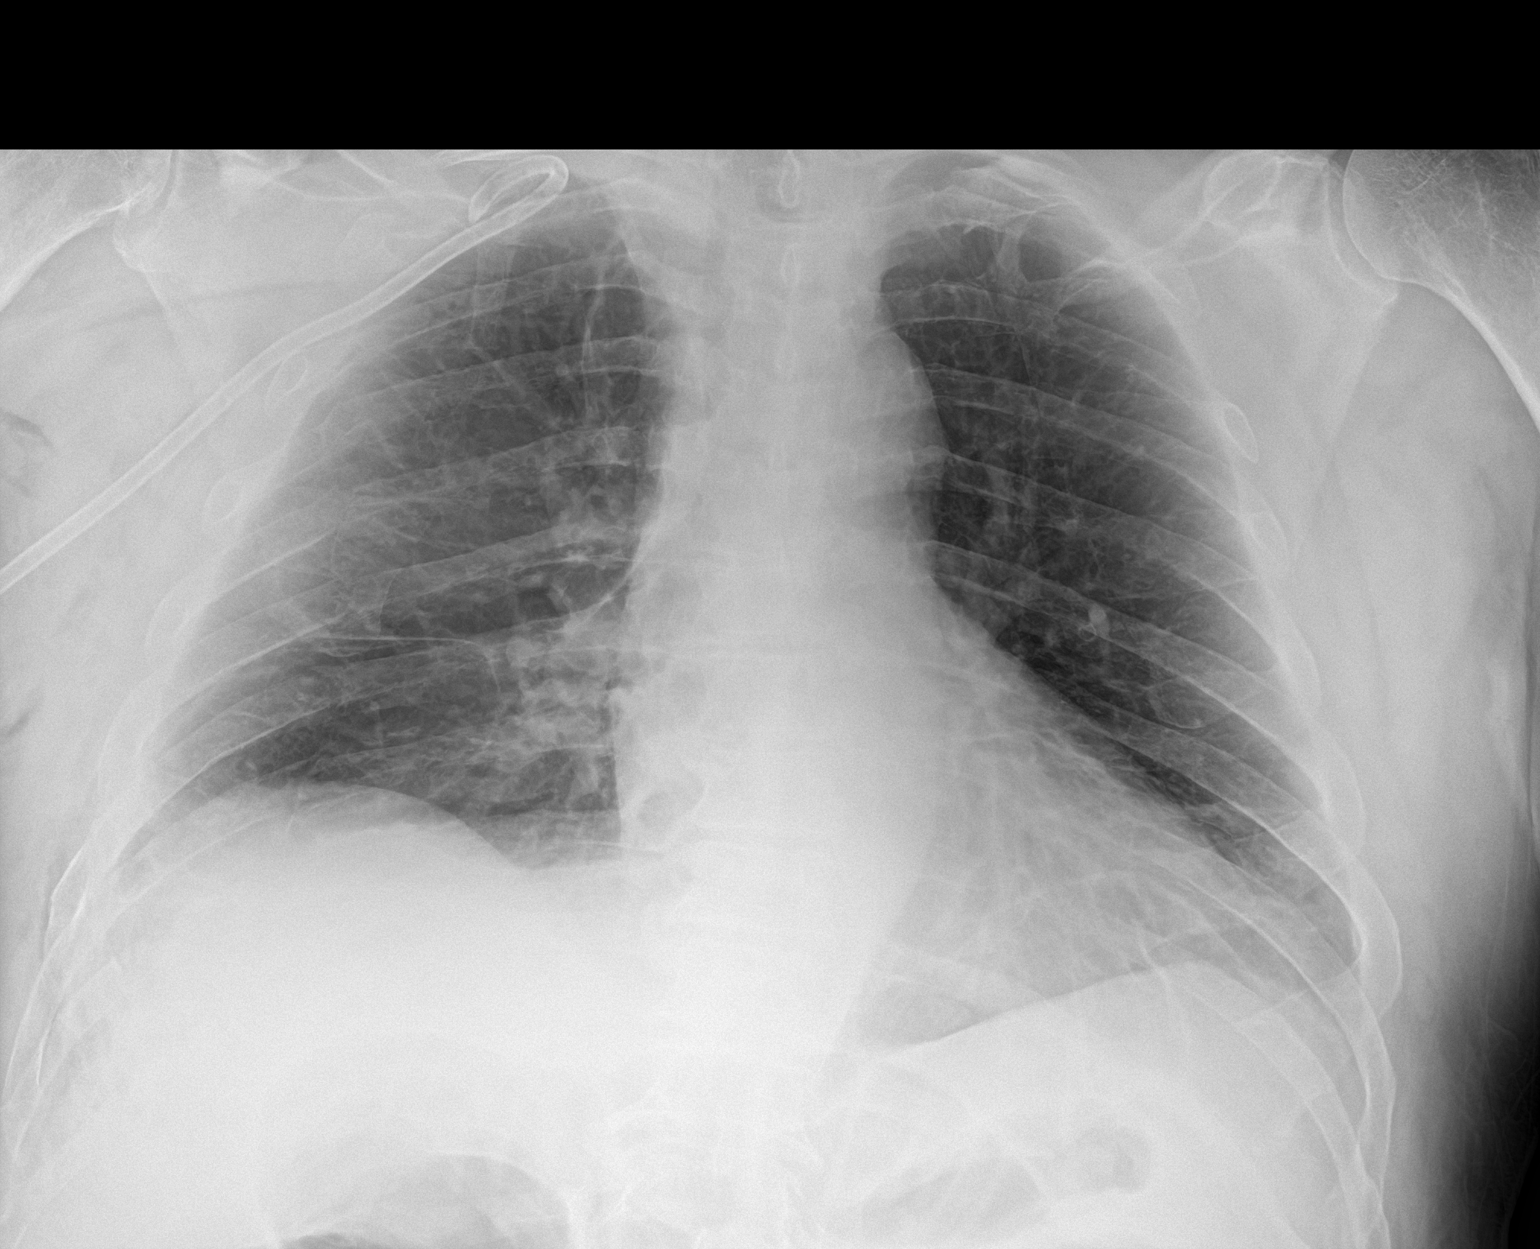

[1 of 1 positions shown; findings below may reference images not displayed]

FINDINGS: Unchanged right apical pigtail chest tube. The heart size and
mediastinal contours are within normal limits. Normal pulmonary
vascularity. Unchanged right basilar atelectasis. No consolidation,
pneumothorax, or large pleural effusion. Unchanged right-sided rib
fractures. Decreasing right chest wall subcutaneous emphysema.
IMPRESSION: 1. Stable chest. Unchanged right apical chest tube and right basilar
atelectasis.

## 2020-07-14 IMAGING — DX DG CHEST 1V PORT
1 series · 1 of 1 positions shown · non-contrast
Comparison: Radiograph April 28, 2018.

CLINICAL DATA: Chest tube in place.

EXAM:
PORTABLE CHEST 1 VIEW

[chest]
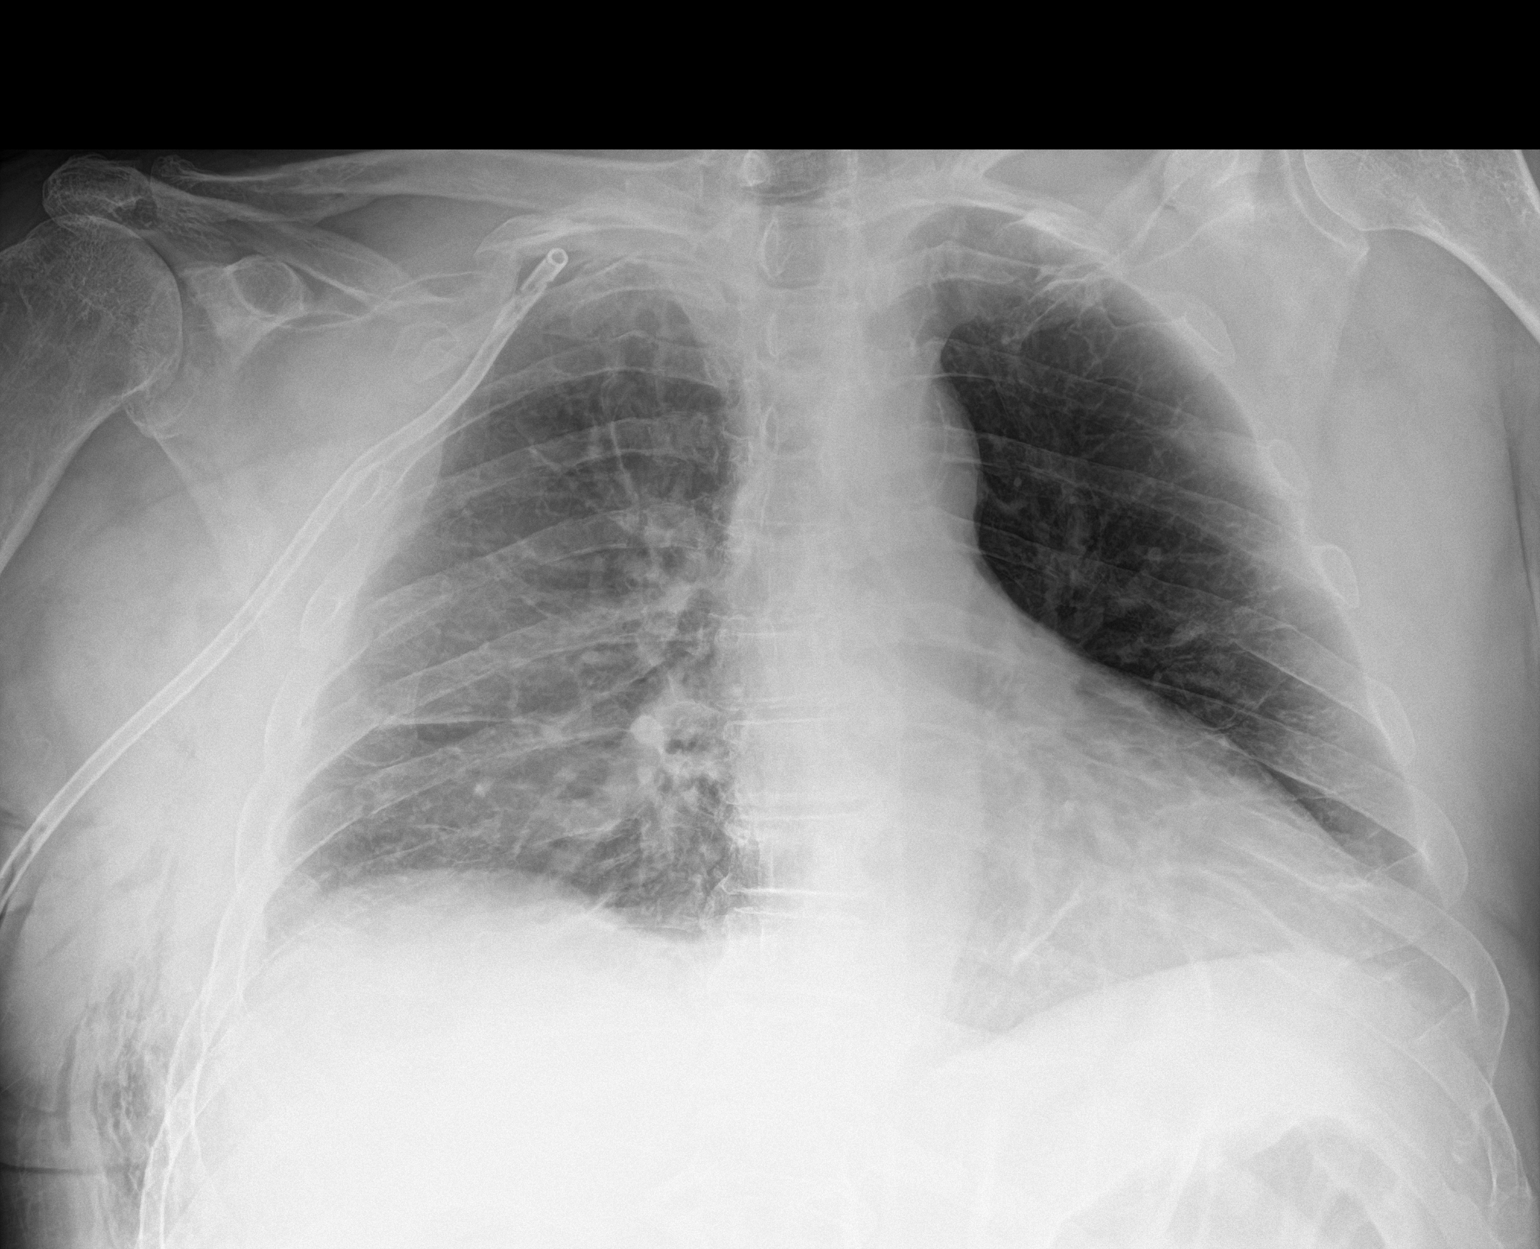

[1 of 1 positions shown; findings below may reference images not displayed]

FINDINGS: The heart size and mediastinal contours are within normal limits.
Right-sided chest tube is unchanged in position. No definite
pneumothorax is noted. Stable right-sided rib fractures are noted
with overlying subcutaneous emphysema. Mild bibasilar subsegmental
atelectasis is noted..
IMPRESSION: Stable position of right-sided chest tube without definite
pneumothorax seen. Mild bibasilar subsegmental atelectasis is noted
with overlying subcutaneous emphysema. Mild bibasilar subsegmental
atelectasis.
